# Patient Record
Sex: Male | Born: 1960 | Race: White | Hispanic: No | Marital: Married | Smoking: Former smoker
Health system: Southern US, Community
[De-identification: ages and names within clinical notes are randomized; demographics above are authoritative.]

## PROBLEM LIST (undated history)

## (undated) DIAGNOSIS — I10 Essential (primary) hypertension: Secondary | ICD-10-CM

## (undated) DIAGNOSIS — M1990 Unspecified osteoarthritis, unspecified site: Secondary | ICD-10-CM

## (undated) DIAGNOSIS — R569 Unspecified convulsions: Secondary | ICD-10-CM

## (undated) DIAGNOSIS — I2510 Atherosclerotic heart disease of native coronary artery without angina pectoris: Secondary | ICD-10-CM

## (undated) DIAGNOSIS — I471 Supraventricular tachycardia, unspecified: Secondary | ICD-10-CM

## (undated) DIAGNOSIS — G4730 Sleep apnea, unspecified: Secondary | ICD-10-CM

## (undated) DIAGNOSIS — D696 Thrombocytopenia, unspecified: Secondary | ICD-10-CM

## (undated) HISTORY — PX: EYE SURGERY: SHX253

## (undated) HISTORY — DX: Supraventricular tachycardia, unspecified: I47.10

## (undated) HISTORY — DX: Sleep apnea, unspecified: G47.30

## (undated) HISTORY — DX: Supraventricular tachycardia: I47.1

## (undated) HISTORY — DX: Essential (primary) hypertension: I10

---

## 2002-01-29 ENCOUNTER — Encounter: Payer: Self-pay | Admitting: Family Medicine

## 2002-01-29 ENCOUNTER — Ambulatory Visit (HOSPITAL_COMMUNITY): Admission: RE | Admit: 2002-01-29 | Discharge: 2002-01-29 | Payer: Self-pay | Admitting: Family Medicine

## 2002-02-18 ENCOUNTER — Ambulatory Visit (HOSPITAL_COMMUNITY): Admission: RE | Admit: 2002-02-18 | Discharge: 2002-02-18 | Payer: Self-pay | Admitting: Orthopaedic Surgery

## 2002-02-18 ENCOUNTER — Encounter: Payer: Self-pay | Admitting: Orthopaedic Surgery

## 2013-11-20 ENCOUNTER — Emergency Department: Payer: Self-pay | Admitting: Emergency Medicine

## 2013-11-20 ENCOUNTER — Telehealth: Payer: Self-pay

## 2013-11-20 LAB — CBC WITH DIFFERENTIAL/PLATELET
Basophil #: 0.1 10*3/uL (ref 0.0–0.1)
Basophil %: 1.2 %
Eosinophil #: 0.1 10*3/uL (ref 0.0–0.7)
Eosinophil %: 1.7 %
HCT: 48.7 % (ref 40.0–52.0)
HGB: 16 g/dL (ref 13.0–18.0)
Lymphocyte #: 1.8 10*3/uL (ref 1.0–3.6)
Lymphocyte %: 31.4 %
MCH: 30 pg (ref 26.0–34.0)
MCHC: 32.8 g/dL (ref 32.0–36.0)
MCV: 91 fL (ref 80–100)
Monocyte #: 0.7 x10 3/mm (ref 0.2–1.0)
Monocyte %: 12.1 %
Neutrophil #: 3 10*3/uL (ref 1.4–6.5)
Neutrophil %: 53.6 %
Platelet: 119 10*3/uL — ABNORMAL LOW (ref 150–440)
RBC: 5.32 10*6/uL (ref 4.40–5.90)
RDW: 14.2 % (ref 11.5–14.5)
WBC: 5.7 10*3/uL (ref 3.8–10.6)

## 2013-11-20 LAB — TSH: Thyroid Stimulating Horm: 1.26 u[IU]/mL

## 2013-11-20 LAB — COMPREHENSIVE METABOLIC PANEL
Albumin: 3.9 g/dL (ref 3.4–5.0)
Alkaline Phosphatase: 121 U/L — ABNORMAL HIGH
Anion Gap: 5 — ABNORMAL LOW (ref 7–16)
BUN: 14 mg/dL (ref 7–18)
Bilirubin,Total: 0.4 mg/dL (ref 0.2–1.0)
Calcium, Total: 8.3 mg/dL — ABNORMAL LOW (ref 8.5–10.1)
Chloride: 107 mmol/L (ref 98–107)
Co2: 28 mmol/L (ref 21–32)
Creatinine: 1.05 mg/dL (ref 0.60–1.30)
EGFR (African American): >60
EGFR (Non-African Amer.): >60
Glucose: 127 mg/dL — ABNORMAL HIGH (ref 65–99)
Osmolality: 281 (ref 275–301)
Potassium: 3.7 mmol/L (ref 3.5–5.1)
SGOT(AST): 26 U/L (ref 15–37)
SGPT (ALT): 27 U/L (ref 12–78)
Sodium: 140 mmol/L (ref 136–145)
Total Protein: 7.8 g/dL (ref 6.4–8.2)

## 2013-11-20 LAB — TROPONIN I: Troponin-I: <0.02 ng/mL

## 2013-11-20 NOTE — Telephone Encounter (Signed)
Called pt to check on him after being seen in [facility] ED today

## 2013-11-20 NOTE — Telephone Encounter (Signed)
Called pt to check on him after being seen in [facility] ED today. Pt reports that his BP at work today was 260/140, is down to 178/109 right now.  States that he is still currently in the ED right now. Pt would like to wait until he gets home to call and sched an appt to see Dr. Mariah MillingGollan.

## 2013-12-01 ENCOUNTER — Encounter (INDEPENDENT_AMBULATORY_CARE_PROVIDER_SITE_OTHER): Payer: Self-pay

## 2013-12-01 ENCOUNTER — Encounter: Payer: Self-pay | Admitting: Cardiovascular Disease

## 2013-12-01 ENCOUNTER — Ambulatory Visit (INDEPENDENT_AMBULATORY_CARE_PROVIDER_SITE_OTHER): Payer: 59 | Admitting: Cardiovascular Disease

## 2013-12-01 VITALS — BP 160/100 | HR 79 | Ht 72.0 in | Wt 199.0 lb

## 2013-12-01 DIAGNOSIS — F17200 Nicotine dependence, unspecified, uncomplicated: Secondary | ICD-10-CM

## 2013-12-01 DIAGNOSIS — R000 Tachycardia, unspecified: Secondary | ICD-10-CM

## 2013-12-01 DIAGNOSIS — F17210 Nicotine dependence, cigarettes, uncomplicated: Secondary | ICD-10-CM | POA: Insufficient documentation

## 2013-12-01 DIAGNOSIS — G4733 Obstructive sleep apnea (adult) (pediatric): Secondary | ICD-10-CM

## 2013-12-01 DIAGNOSIS — F1010 Alcohol abuse, uncomplicated: Secondary | ICD-10-CM | POA: Insufficient documentation

## 2013-12-01 DIAGNOSIS — I498 Other specified cardiac arrhythmias: Secondary | ICD-10-CM

## 2013-12-01 DIAGNOSIS — I471 Supraventricular tachycardia: Secondary | ICD-10-CM

## 2013-12-01 DIAGNOSIS — I10 Essential (primary) hypertension: Secondary | ICD-10-CM | POA: Insufficient documentation

## 2013-12-01 MED ORDER — HYDROCHLOROTHIAZIDE 25 MG PO TABS: 25.0000 mg | ORAL_TABLET | Freq: Every day | ORAL | Status: DC

## 2013-12-01 MED ORDER — POTASSIUM CHLORIDE ER 10 MEQ PO TBCR: 10.0000 meq | EXTENDED_RELEASE_TABLET | Freq: Every day | ORAL | Status: DC

## 2013-12-01 MED ORDER — PROPRANOLOL HCL 10 MG PO TABS: 10.0000 mg | ORAL_TABLET | Freq: Four times a day (QID) | ORAL | Status: DC | PRN

## 2013-12-01 NOTE — Assessment & Plan Note (Addendum)
Wyatt Santiago presents with marked hypertension. He eats An unrestricted diet including a very high salt. He drinks 6-8 beers everyday. He smokes 1-1/2 packs of cigarettes a day. He also does not get any regular exercise.  For now we will continue the diltiazem which is also helping with his supraventricular tachycardia. We'll add hydrochlorothiazide 25 mg date and potassium chloride 10 mg a day. I think that he'll improve significantly with lifestyle modifications including salt restriction, reduction of his alcohol intake, smoking cessation, and regular exercise.  I have also strongly encouraged him to go back to his meds regular medical doctor and get fitted for a CPAP mask. He's been diagnosed with sleep apnea but never went to get the CPAP fitted.  I told him that he is at high risk for palpitations related to his marked hypertension including stroke, congestive heart failure, coronary artery artery disease, and renal failure.  He has some nonspecific ST and T wave changes which are likely due to his hypertension and maybe due to left hypertrophy. We'll likely need an echocardiogram at some point.  He's not having any symptoms of ischemic heart disease.

## 2013-12-01 NOTE — Assessment & Plan Note (Signed)
Wyatt Santiago presented to the emergency room with an episode of supraventricular tachycardia. I do not have all the records from [facility] but it sounds like the SVT resolved with IV adenosine. He was sent home on diltiazem 120 mg a day.  He's not had any recurrent episodes of SVT but he complains that the diltiazem is making him fatigued.  We had long discussion about medical therapy of SVT versus RF ablation. He has hypertension so he'll need to take some blood pressure medicines anyway and he would like to try medical therapy initially.  We'll continue the diltiazem for now. He may use to the diltiazem and tolerate it better in the future. If not, we can change him to metoprolol for another beta blocker which may offer the same protection against SVT as well some blood pressure reduction. We'll also give him some propranolol to take on an as needed basis if he has recurrent episodes of SVT.We discussed valsalva and stimulation of the diving reflex

## 2013-12-01 NOTE — Patient Instructions (Signed)
Your physician has recommended you make the following change in your medication:  Start HCTZ 25 mg daily  Start K Dur 10 meq daily  Start Propranolol 10 mg 4 times daily as needed for SVT  Your physician recommends that you schedule a follow-up appointment in:  2-3 weeks   Your physician recommends that you return for lab work in:  BMP at your 2-3 week follow up

## 2013-12-01 NOTE — Assessment & Plan Note (Signed)
Has history of sleep apnea that was diagnosed many years ago. He still has multiple actinic episodes according to his family. He never went to get the CPAP mask because he is claustrophobic and didn't think that he would be aware. I strongly encouraged him to call his doctor in be fitted for CPAP mask. This could be part of the issue with his HTN.    He is not obese.  Some of his sleep apnea could be his ETOH intake.

## 2013-12-01 NOTE — Assessment & Plan Note (Signed)
He drinks 6-8 beers everyday. I have strongly encouraged him to reduce his alcohol intake to perhaps 2-3 beers a day. This will decrease the likelihood of alcohol-related complications and will also help with his blood pressure control.

## 2013-12-01 NOTE — Progress Notes (Addendum)
     Mortimer FriesSteven R Langill Date of Birth  09-Sep-1960       [facility]    [place] [facility] [address], [address]  [address], [address] [place], [place]  [address]   [place]  [id number] [phone]    [phone]   Fax  [phone]     Fax [phone]  Problem List: 1. HTN 2.  Obstructive sleep apnea-     History of Present Illness:  Pt has a hx of HTN.  The other day, he had an episode of weakness, mental status changes.  Slurred speech.  A co worker took BP and HR  - found to be elevated.    Went to ER, was found to have SVT.  Received iv adenosine.      Was DC'd on diltiazem 120 a day.  Makes him fatigued.    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  Does not follow any specific diet.  Drinks 6-8 beers a day.    No regular exercise  Meds:   dilt 120 a day   No Known Allergies  Past Medical History  Diagnosis Date  . Hypertension   . Sleep apnea     History reviewed. No pertinent past surgical history.  History  Smoking status  . Current Every Day Smoker -- 1.50 packs/day for 30 years  . Types: Cigarettes  Smokeless tobacco  . Not on file    History  Alcohol Use  . 4.8 oz/week  . 8 Cans of beer per week    Family History  Problem Relation Age of Onset  . Hypertension Father     Reviw of Systems:  Reviewed in the HPI.  All other systems are negative.  Physical Exam: Blood pressure 160/100, pulse 79, height 6' (1.829 m), weight 199 lb (90.266 kg). Wt Readings from Last 3 Encounters:  12/01/13 199 lb (90.266 kg)     General: Well developed, well nourished, in no acute distress.  Head: Normocephalic, atraumatic, sclera non-icteric, mucus membranes are moist,   Neck: Supple. Carotids are 2 + without bruits. No JVD   Lungs: Clear   Heart: RR, soft systolic murmur  Abdomen: Soft, non-tender, non-distended with normal bowel sounds.  Msk:  Strength and tone are normal   Extremities: No  clubbing or cyanosis. No edema.  Distal pedal pulses are 2+ and equal    Neuro: CN II - XII intact.  Alert and oriented X 3.   Psych:  Normal   ECG:  A;pril 13, 2015:  NSR , NS ST changes  Assessment / Plan:

## 2013-12-01 NOTE — Assessment & Plan Note (Signed)
i have strongly encouraged him to stop smoking

## 2013-12-16 ENCOUNTER — Ambulatory Visit (INDEPENDENT_AMBULATORY_CARE_PROVIDER_SITE_OTHER): Payer: 59 | Admitting: Cardiovascular Disease

## 2013-12-16 ENCOUNTER — Encounter: Payer: Self-pay | Admitting: Cardiovascular Disease

## 2013-12-16 VITALS — BP 179/98 | HR 74 | Ht 72.0 in | Wt 197.0 lb

## 2013-12-16 DIAGNOSIS — I10 Essential (primary) hypertension: Secondary | ICD-10-CM

## 2013-12-16 DIAGNOSIS — I498 Other specified cardiac arrhythmias: Secondary | ICD-10-CM

## 2013-12-16 DIAGNOSIS — F1010 Alcohol abuse, uncomplicated: Secondary | ICD-10-CM

## 2013-12-16 DIAGNOSIS — I471 Supraventricular tachycardia: Secondary | ICD-10-CM

## 2013-12-16 MED ORDER — LISINOPRIL 10 MG PO TABS: 10.0000 mg | ORAL_TABLET | Freq: Every day | ORAL | Status: DC

## 2013-12-16 MED ORDER — DILTIAZEM HCL ER COATED BEADS 240 MG PO CP24: 240.0000 mg | ORAL_CAPSULE | Freq: Every day | ORAL | Status: DC

## 2013-12-16 NOTE — Assessment & Plan Note (Signed)
His blood pressure is still elevated. Fortunately, he's not had any further episodes of SVT. We will increase his diltiazem to 240 mg a day. We'll also add lisinopril 10 mg a day. I've encouraged him to stop smoking cigarettes completely which will help his blood pressure.  We'll check basic metabolic profile today. I'll see him in 3 months for followup office visit and basic metabolic profile

## 2013-12-16 NOTE — Assessment & Plan Note (Signed)
He is not drinking as much.  I've told him that excessive ETOH may worsen his sleep apnea.  He will discuss this further with his medical doctor.

## 2013-12-16 NOTE — Patient Instructions (Signed)
Your physician has recommended you make the following change in your medication:  Increase Diltiazem to 240 mg daily  Start Lisinopril 10 mg daily   Your physician recommends that you have lab work today: BMP  Your physician wants you to follow-up in: 3 months. You will receive a reminder letter in the mail two months in advance. If you don't receive a letter, please call our office to schedule the follow-up appointment.  Your physician recommends that you return for lab work in:  3 months BMP

## 2013-12-16 NOTE — Assessment & Plan Note (Signed)
He's not had any further episodes of supraventricular tachycardia. She's on low-dose diltiazem. We will increase his diltiazem to help his blood pressure.

## 2013-12-16 NOTE — Progress Notes (Signed)
Wyatt Santiago Date of Birth  12-30-1960       [facility]    [place] [facility] [address]  [address] [place]  [address]   [place], [place]  [id number] [phone]    [phone]   Fax  [phone]     Fax [phone]  Problem List: 1. HTN 2.  Obstructive sleep apnea-   3. Supraventricular tachycardia   History of Present Illness:  Pt has a hx of HTN.  The other day, he had an episode of weakness, mental status changes.  Slurred speech.  A co worker took BP and HR  - found to be elevated.    Went to ER, was found to have SVT.  Received iv adenosine.      Was DC'd on diltiazem 120 a day.  Makes him fatigued.    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  Does not follow any specific diet.  Drinks 6-8 beers a day.    No regular exercise  December 16, 2013:  Pt presents today for followup of his supraventricular tachycardia. I perceive [facility] and he did indeed have SVT treated with adenosine. He was started on diltiazem. He's not had any further episodes of SVT. His blood pressure remains elevated. He's been watching his diet. He's been limiting fried foods. He's tried to cut back on his smoking.  Meds:    Current Outpatient Prescriptions on File Prior to Visit  Medication Sig Dispense Refill  . diltiazem (CARDIZEM SR) 120 MG 12 hr capsule Take 120 mg by mouth 2 (two) times daily.      . hydrochlorothiazide (HYDRODIURIL) 25 MG tablet Take 1 tablet (25 mg total) by mouth daily.  90 tablet  3  . ibuprofen (ADVIL,MOTRIN) 100 MG tablet Take 100 mg by mouth every 6 (six) hours as needed for fever.      . potassium chloride (K-DUR) 10 MEQ tablet Take 1 tablet (10 mEq total) by mouth daily.  90 tablet  3  . propranolol (INDERAL) 10 MG tablet Take 1 tablet (10 mg total) by mouth 4 (four) times daily as needed (for SVT).  30 tablet  12   No current facility-administered  medications on file prior to visit.      No Known Allergies  Past Medical History  Diagnosis Date  . Hypertension   . Sleep apnea     History reviewed. No pertinent past surgical history.  History  Smoking status  . Current Every Day Smoker -- 1.50 packs/day for 30 years  . Types: Cigarettes  Smokeless tobacco  . Not on file    History  Alcohol Use  . 4.8 oz/week  . 8 Cans of beer per week    Family History  Problem Relation Age of Onset  . Hypertension Father     Reviw of Systems:  Reviewed in the HPI.  All other systems are negative.  Physical Exam: Blood pressure 179/98, pulse 74, height 6' (1.829 m), weight 197 lb (89.359 kg). Wt Readings from Last 3 Encounters:  12/16/13 197 lb (89.359 kg)  12/01/13 199 lb (90.266 kg)   General: Well developed, well nourished, in no acute distress.  Head: Normocephalic, atraumatic, sclera non-icteric, mucus membranes are moist,   Neck: Supple. Carotids are 2 + without bruits. No JVD   Lungs: Clear   Heart: RR, soft systolic murmur  Abdomen: Soft, non-tender, non-distended with normal bowel sounds.  Msk:  Strength and tone are normal   Extremities: No clubbing or cyanosis. No edema.  Distal pedal pulses are 2+ and equal    Neuro: CN II - XII intact.  Alert and oriented X 3.   Psych:  Normal   ECG:  12/16/2013: Normal sinus rhythm at 74. His left ventricular hypertrophy with QRS widening  Assessment / Plan:

## 2013-12-16 NOTE — Assessment & Plan Note (Signed)
He has a history of sleep apnea. He needs to get this evaluated further. He does not use a CPAP mask.

## 2013-12-17 LAB — BASIC METABOLIC PANEL
BUN/Creatinine Ratio: 18 (ref 9–20)
BUN: 16 mg/dL (ref 6–24)
CO2: 25 mmol/L (ref 18–29)
Calcium: 9.1 mg/dL (ref 8.7–10.2)
Chloride: 97 mmol/L (ref 97–108)
Creatinine, Ser: 0.89 mg/dL (ref 0.76–1.27)
GFR calc Af Amer: 112 mL/min/{1.73_m2} (ref >59)
GFR calc non Af Amer: 97 mL/min/{1.73_m2} (ref >59)
Glucose: 121 mg/dL — ABNORMAL HIGH (ref 65–99)
Potassium: 3.7 mmol/L (ref 3.5–5.2)
Sodium: 140 mmol/L (ref 134–144)

## 2013-12-18 ENCOUNTER — Telehealth: Payer: Self-pay | Admitting: Cardiovascular Disease

## 2013-12-18 NOTE — Telephone Encounter (Signed)
New message ° ° ° ° °Returning Wyatt Santiago's call °

## 2013-12-18 NOTE — Progress Notes (Signed)
Quick Note:  Patient notified of lab results. Discussed limiting sugar intake as glucose was elevated to 121. Patient verbalized agreement with current treatment plan and said he would watch out for extra sugar intake. Patient has no further questions. ______

## 2013-12-18 NOTE — Telephone Encounter (Signed)
Patient notified of lab results. Discussed limiting sugar intake as glucose was elevated to 121.  Patient verbalized agreement with current treatment plan and said he would watch out for extra sugar intake. Patient has no further questions.

## 2014-03-07 ENCOUNTER — Encounter (HOSPITAL_COMMUNITY): Payer: Self-pay | Admitting: Emergency Medicine

## 2014-03-07 ENCOUNTER — Emergency Department (HOSPITAL_COMMUNITY)
Admission: EM | Admit: 2014-03-07 | Discharge: 2014-03-07 | Disposition: A | Discharge status: Home / Self Care | Payer: 59 | Attending: Emergency Medicine | Admitting: Emergency Medicine

## 2014-03-07 DIAGNOSIS — S6992XA Unspecified injury of left wrist, hand and finger(s), initial encounter: Secondary | ICD-10-CM

## 2014-03-07 DIAGNOSIS — F17200 Nicotine dependence, unspecified, uncomplicated: Secondary | ICD-10-CM | POA: Insufficient documentation

## 2014-03-07 DIAGNOSIS — W268XXA Contact with other sharp object(s), not elsewhere classified, initial encounter: Secondary | ICD-10-CM | POA: Insufficient documentation

## 2014-03-07 DIAGNOSIS — S60459A Superficial foreign body of unspecified finger, initial encounter: Secondary | ICD-10-CM | POA: Insufficient documentation

## 2014-03-07 DIAGNOSIS — Z23 Encounter for immunization: Secondary | ICD-10-CM | POA: Insufficient documentation

## 2014-03-07 DIAGNOSIS — I10 Essential (primary) hypertension: Secondary | ICD-10-CM | POA: Insufficient documentation

## 2014-03-07 DIAGNOSIS — Y9389 Activity, other specified: Secondary | ICD-10-CM | POA: Insufficient documentation

## 2014-03-07 DIAGNOSIS — Y929 Unspecified place or not applicable: Secondary | ICD-10-CM | POA: Insufficient documentation

## 2014-03-07 DIAGNOSIS — Z79899 Other long term (current) drug therapy: Secondary | ICD-10-CM | POA: Insufficient documentation

## 2014-03-07 MED ORDER — TETANUS-DIPHTH-ACELL PERTUSSIS 5-2.5-18.5 LF-MCG/0.5 IM SUSP
0.5000 mL | Freq: Once | INTRAMUSCULAR | Status: AC
  Administered 2014-03-07: 0.5 mL via INTRAMUSCULAR
  Filled 2014-03-07: qty 0.5

## 2014-03-07 MED ORDER — LIDOCAINE HCL (PF) 2 % IJ SOLN
INTRAMUSCULAR | Status: AC
  Administered 2014-03-07: 22:00:00
  Filled 2014-03-07: qty 10

## 2014-03-07 MED ORDER — HYDROGEN PEROXIDE 3 % EX SOLN
CUTANEOUS | Status: AC
  Administered 2014-03-07: 22:00:00
  Filled 2014-03-07: qty 473

## 2014-03-07 NOTE — ED Notes (Signed)
Pt has a fish hook in his left middle finger.

## 2014-03-07 NOTE — ED Provider Notes (Signed)
CSN: [id number]     Arrival date & time 03/07/14  2137 History   First MD Initiated Contact with Patient 03/07/14 2204     This chart was scribed for Wyatt Santiago, * by Arlan Organ, ED Scribe. This patient was seen in room APA09/APA09 and the patient's care was started 10:08 PM.   No chief complaint on file.  HPI  HPI Comments: Wyatt Santiago is a 53 y.o. Male with a PMHx of HTN and sleep apnea who presents to the Emergency Department complaining of a fish hook to the L 3rd digit sustained 1.5 hours prior to arrival. Pt states he was fishing when he accidentally hooked his finger earlier today. Pt states he attempted to pull the hook out without any success. At this time he denies any fever or chills. No weakness, numbness, paresthesia, or loss of sensation. Pt is due for a Tetanus shot today. No known allergies to medications. He has no other pertinent past medical history. No other concerns this visit.   Past Medical History  Diagnosis Date  . Hypertension   . Sleep apnea    History reviewed. No pertinent past surgical history. Family History  Problem Relation Age of Onset  . Hypertension Father    History  Substance Use Topics  . Smoking status: Current Every Day Smoker -- 1.50 packs/day for 30 years    Types: Cigarettes  . Smokeless tobacco: Not on file  . Alcohol Use: 4.8 oz/week    8 Cans of beer per week    Review of Systems  Constitutional: Negative for fever and chills.  Skin: Positive for wound. Negative for rash.  Psychiatric/Behavioral: Negative for confusion.      Allergies  Review of patient's allergies indicates no known allergies.  Home Medications   Prior to Admission medications   Medication Sig Start Date End Date Taking? Authorizing Provider  diltiazem (CARDIZEM CD) 240 MG 24 hr capsule Take 1 capsule (240 mg total) by mouth daily. 12/16/13   Vesta Mixer, MD  hydrochlorothiazide (HYDRODIURIL) 25 MG tablet Take 1 tablet (25 mg total) by  mouth daily. 12/01/13   Vesta Mixer, MD  ibuprofen (ADVIL,MOTRIN) 100 MG tablet Take 100 mg by mouth every 6 (six) hours as needed for fever.    Historical Provider, MD  lisinopril (PRINIVIL,ZESTRIL) 10 MG tablet Take 1 tablet (10 mg total) by mouth daily. 12/16/13   Vesta Mixer, MD  potassium chloride (K-DUR) 10 MEQ tablet Take 1 tablet (10 mEq total) by mouth daily. 12/01/13   Vesta Mixer, MD  propranolol (INDERAL) 10 MG tablet Take 1 tablet (10 mg total) by mouth 4 (four) times daily as needed (for SVT). 12/01/13   Vesta Mixer, MD   Triage Vitals: BP 136/82  Pulse 100  Temp(Src) 98.6 F (37 C) (Oral)  Resp 20  Ht 6' (1.829 m)  Wt 186 lb (84.369 kg)  BMI 25.22 kg/m2  SpO2 100%   Physical Exam  Constitutional: He is oriented to person, place, and time. He appears well-developed and well-nourished. No distress.  HENT:  Head: Normocephalic and atraumatic.  Right Ear: Hearing normal.  Left Ear: Hearing normal.  Nose: Nose normal.  Mouth/Throat: Oropharynx is clear and moist and mucous membranes are normal.  Eyes: Conjunctivae and EOM are normal. Pupils are equal, round, and reactive to light.  Neck: Normal range of motion. Neck supple.  Cardiovascular: Regular rhythm, S1 normal and S2 normal.  Exam reveals no gallop and no  friction rub.   No murmur heard. Pulmonary/Chest: Effort normal and breath sounds normal. No respiratory distress. He exhibits no tenderness.  Abdominal: Soft. Normal appearance and bowel sounds are normal. There is no hepatosplenomegaly. There is no tenderness. There is no rebound, no guarding, no tenderness at McBurney's point and negative Murphy's sign. No hernia.  Musculoskeletal: Normal range of motion.  Neurological: He is alert and oriented to person, place, and time. He has normal strength. No cranial nerve deficit or sensory deficit. Coordination normal. GCS eye subscore is 4. GCS verbal subscore is 5. GCS motor subscore is 6.  Skin: Skin is warm,  dry and intact. No rash noted. No cyanosis.  Psychiatric: He has a normal mood and affect. His speech is normal and behavior is normal. Thought content normal.    ED Course  Procedures (including critical care time)  Procedure: Digital Block Area prepped with betadine and sterile towels applied. Landmarks identified. A 27-guage 1 1/4 inch needle was advanced dorsally, adjacent to the base of the proximal phalynx. Aspiration revealed no blood return. 4ml of 1% Lidocaine was injected. The steps were repeated on the other side of the phalynx. Patient tolerated the procedure well and there were no complications.  Procedure: Fishhook removal Area was prepped with Betadine. Barbara the hook was disengaged from the skin by gently changing the angle of the hook and then advancing 18-gauge needle along the shaft of the hook until the barb was encountered. The barb was then covered with the tip of the 18-gauge needle and the needle and fishhook were easily removed the skin. Patient tolerated procedure well. No complications.  DIAGNOSTIC STUDIES: Oxygen Saturation is 100% on RA, normal by my interpretation.    COORDINATION OF CARE: 10:13 PM- Will pull hook out of finger under local anesthetic. Discussed treatment plan with pt at bedside and pt agreed to plan.     Labs Review Labs Reviewed - No data to display  Imaging Review No results found.   EKG Interpretation None      MDM   Final diagnoses:  Fish hook injury of finger of left hand, initial encounter    Presented to the ER for evaluation of a fishhook in the tip of the left middle finger. A digital block was performed and a fishhook was removed without difficulty. Patient administered tetanus vaccination. The area was cleaned and dressed appropriately. Patient will watch for signs of infection.   I personally performed the services described in this documentation, which was scribed in my presence. The recorded information has been  reviewed and is accurate.    Wyatt Creasehristopher J. Pollina, MD 03/07/14 2228

## 2014-03-07 NOTE — Discharge Instructions (Signed)
Fish Hook Removal °A fish hook can cause a small cut or lesion that extends through all layers of the skin and into the subcutaneous tissue. Because of this, bacteria may get injected beneath the surface of the skin. A simple bandage (dressing) may be applied. This should be changed daily. Follow your caregiver's instructions regarding use of any antibacterial ointments.  °Only take over-the-counter or prescription medicines for pain, discomfort, or fever as directed by your caregiver. °If you did not receive a tetanus shot from your caregiver because you did not recall when your last one was given, make sure to check with your physician's office and determine if one is needed. Generally for a "dirty" wound, you should receive a tetanus booster if you have not had one in the last five years. If you have a "clean" wound, you should receive a tetanus booster if you have not had one within the last ten years. °SEEK IMMEDIATE MEDICAL CARE IF:  °· You develop redness, swelling, or increasing pain in the wound. °· You have a fever. °· You notice a bad smell coming from the wound or dressing. °· You notice pus or other unusual drainage coming from the wound. °Document Released: 08/04/2000 Document Revised: 10/30/2011 Document Reviewed: 02/25/2009 °Exit[employer]® Patient Information ©2015 [employer]. This information is not intended to replace advice given to you by your health care provider. Make sure you discuss any questions you have with your health care provider. ° ° °

## 2014-03-17 ENCOUNTER — Ambulatory Visit (INDEPENDENT_AMBULATORY_CARE_PROVIDER_SITE_OTHER): Payer: 59 | Admitting: Cardiovascular Disease

## 2014-03-17 ENCOUNTER — Encounter: Payer: Self-pay | Admitting: Cardiovascular Disease

## 2014-03-17 VITALS — BP 110/80 | HR 85 | Ht 72.0 in | Wt 186.5 lb

## 2014-03-17 DIAGNOSIS — I471 Supraventricular tachycardia: Secondary | ICD-10-CM

## 2014-03-17 DIAGNOSIS — I498 Other specified cardiac arrhythmias: Secondary | ICD-10-CM

## 2014-03-17 DIAGNOSIS — I10 Essential (primary) hypertension: Secondary | ICD-10-CM

## 2014-03-17 NOTE — Assessment & Plan Note (Signed)
His blood pressure is much better controlled today and in fact he has had symptoms consistent with orthostatic hypotension. I think that this is a combination of his HCTZ plus the fact that he is improved his diet and he's decreased his smoking. In addition, he's been walking 2 miles every day.  At this point we'll stop the HCTZ and potassium.  I've encouraged him to continue a healthy lifestyle. He will continue to monitor but his blood pressure and will call his back and his blood pressure readings increase. I'll see him in 6 months for followup visit.

## 2014-03-17 NOTE — Patient Instructions (Signed)
Your physician has recommended you make the following change in your medication:  Stop HCTZ  Stop Potassium   Your physician wants you to follow-up in: 6 months. You will receive a reminder letter in the mail two months in advance. If you don't receive a letter, please call our office to schedule the follow-up appointment.

## 2014-03-17 NOTE — Progress Notes (Signed)
Wyatt FriesSteven R Goyne Date of Birth  Mar 03, 1961       [facility]    [facility] [place] [facility] [address], [address]  [address], [address] [place], [place]  [address]   [place], [place]  [address] [phone]    [phone]   Fax  [phone]     Fax [phone]  Problem List: 1. HTN 2.  Obstructive sleep apnea-   3. Supraventricular tachycardia   History of Present Illness:  Pt has a hx of HTN.  The other day, he had an episode of weakness, mental status changes.  Slurred speech.  A co worker took BP and HR  - found to be elevated.    Went to ER, was found to have SVT.  Received iv adenosine.      Was DC'd on diltiazem 120 a day.  Makes him fatigued.    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  Does not follow any specific diet.  Drinks 6-8 beers a day.    No regular exercise  December 16, 2013:  Pt presents today for followup of his supraventricular tachycardia. I perceive the E[facility] is from [facility] and he did indeed have SVT treated with adenosine. He was started on diltiazem. He's not had any further episodes of SVT. His blood pressure remains elevated. He's been watching his diet. He's been limiting fried foods. He's tried to cut back on his smoking.  03/17/2014: [name] is seen back today for followup visit. He has a history of SVT, hypertension.Marland Kitchen. He's not had any further episodes of SVT.  He's been using an electronic cigarette and has cut back on his smoking .  He's cut his alcohol intake quite dramatically. He has occasional episodes of presyncope. These symptoms sound like orthostatic hypotension. It primarily occur at work when he goes from a stooped position to a standing position.   His orthostatic hypotension seemed to start after we started the HCTZ.  Meds:    Current Outpatient Prescriptions on File Prior to Visit  Medication Sig Dispense Refill  . diltiazem (CARDIZEM CD) 240 MG 24 hr capsule Take 1 capsule  (240 mg total) by mouth daily.  90 capsule  3  . hydrochlorothiazide (HYDRODIURIL) 25 MG tablet Take 1 tablet (25 mg total) by mouth daily.  90 tablet  3  . ibuprofen (ADVIL,MOTRIN) 100 MG tablet Take 100 mg by mouth every 6 (six) hours as needed for fever.      Marland Kitchen. lisinopril (PRINIVIL,ZESTRIL) 10 MG tablet Take 1 tablet (10 mg total) by mouth daily.  90 tablet  3  . potassium chloride (K-DUR) 10 MEQ tablet Take 1 tablet (10 mEq total) by mouth daily.  90 tablet  3  . propranolol (INDERAL) 10 MG tablet Take 1 tablet (10 mg total) by mouth 4 (four) times daily as needed (for SVT).  30 tablet  12   No current facility-administered medications on file prior to visit.      Not on File  Past Medical History  Diagnosis Date  . Hypertension   . Sleep apnea     History reviewed. No pertinent past surgical history.  History  Smoking status  . Current Every Day Smoker -- 1.50 packs/day for 30 years  . Types: Cigarettes  Smokeless tobacco  . Not on file    History  Alcohol Use  . 4.8 oz/week  . 8 Cans of beer per week  Family History  Problem Relation Age of Onset  . Hypertension Father     Reviw of Systems:  Reviewed in the HPI.  All other systems are negative.  Physical Exam: Blood pressure 110/80, pulse 85, height 6' (1.829 m), weight 186 lb 8 oz (84.596 kg). Wt Readings from Last 3 Encounters:  03/17/14 186 lb 8 oz (84.596 kg)  03/07/14 186 lb (84.369 kg)  12/16/13 197 lb (89.359 kg)   General: Well developed, well nourished, in no acute distress.  Head: Normocephalic, atraumatic, sclera non-icteric, mucus membranes are moist,   Neck: Supple. Carotids are 2 + without bruits. No JVD   Lungs: Clear   Heart: RR, soft systolic murmur  Abdomen: Soft, non-tender, non-distended with normal bowel sounds.  Msk:  Strength and tone are normal   Extremities: No clubbing or cyanosis. No edema.  Distal pedal pulses are 2+ and equal    Neuro: CN II - XII intact.  Alert  and oriented X 3.   Psych:  Normal   ECG:  03/17/2014: Normal sinus rhythm at 85. He has no ST or T wave changes.  Assessment / Plan:

## 2014-05-18 ENCOUNTER — Other Ambulatory Visit: Payer: Self-pay

## 2014-05-18 MED ORDER — DILTIAZEM HCL ER COATED BEADS 240 MG PO CP24: 240.0000 mg | ORAL_CAPSULE | Freq: Every day | ORAL | Status: DC

## 2014-05-18 MED ORDER — LISINOPRIL 10 MG PO TABS: 10.0000 mg | ORAL_TABLET | Freq: Every day | ORAL | Status: DC

## 2014-05-18 NOTE — Telephone Encounter (Signed)
Refill sent for dilitiazem and lisinopril

## 2014-11-12 ENCOUNTER — Ambulatory Visit (INDEPENDENT_AMBULATORY_CARE_PROVIDER_SITE_OTHER): Payer: 59 | Admitting: Cardiovascular Disease

## 2014-11-12 ENCOUNTER — Encounter: Payer: Self-pay | Admitting: Cardiovascular Disease

## 2014-11-12 VITALS — BP 144/92 | HR 73 | Ht 72.0 in | Wt 197.2 lb

## 2014-11-12 DIAGNOSIS — I10 Essential (primary) hypertension: Secondary | ICD-10-CM | POA: Diagnosis not present

## 2014-11-12 DIAGNOSIS — I471 Supraventricular tachycardia: Secondary | ICD-10-CM | POA: Diagnosis not present

## 2014-11-12 MED ORDER — LISINOPRIL 20 MG PO TABS: 20.0000 mg | ORAL_TABLET | Freq: Every day | ORAL | Status: DC

## 2014-11-12 NOTE — Progress Notes (Signed)
Cardiology Office Note   Date:  11/12/2014   ID:  Wyatt FriesSteven R Santiago, DOB 03/22/1961, MRN [id number]  PCP:  Wyatt SmilesFUSCO,LAWRENCE Santiago., MD  Cardiologist:   Wyatt MixerNahser, Philip J, MD   Chief Complaint  Patient presents with  . other    6 month f/u no complaints. Meds reviewd verbally with pt.   1. HTN 2. Obstructive sleep apnea-  3. Supraventricular tachycardia   History of Present Illness:  Pt has a hx of HTN. The other day, he had an episode of weakness, mental status changes. Slurred speech. A co worker took BP and HR - found to be elevated. Went to ER, was found to have SVT. Received iv adenosine. Was DC'd on diltiazem 120 a day. Makes him fatigued.   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  Does not follow any specific diet.  Drinks 6-8 beers a day.   No regular exercise  December 16, 2013:  Pt presents today for followup of his supraventricular tachycardia. I perceive the [facility] and he did indeed have SVT treated with adenosine. He was started on diltiazem. He's not had any further episodes of SVT. His blood pressure remains elevated. He's been watching his diet. He's been limiting fried foods. He's tried to cut back on his smoking.  03/17/2014: Wyatt Santiago is seen back today for followup visit. He has a history of SVT, hypertension.Marland Kitchen. He's not had any further episodes of SVT. He's been using an electronic cigarette and has cut back on his smoking . He's cut his alcohol intake quite dramatically. He has occasional episodes of presyncope. These symptoms sound like orthostatic hypotension. It primarily occur at work when he goes from a stooped position to a standing position. His orthostatic hypotension seemed to start after we started the HCTZ.   November 12, 2014:  Wyatt FriesSteven R Santiago is a 54 y.o. male who presents for follow-up of his superventricular tachycardia and hypertension. During his last visit we discontinued  the HCTZ because of symptoms of orthostatic hypotension.  He has done well.  No further episodes of orthostatic hypertension. He also has not had any further episodes of SVT.  Past Medical History  Diagnosis Date  . Hypertension   . Sleep apnea     History reviewed. No pertinent past surgical history.   Current Outpatient Prescriptions  Medication Sig Dispense Refill  . diltiazem (CARDIZEM CD) 240 MG 24 hr capsule Take 1 capsule (240 mg total) by mouth daily. (Patient not taking: Reported on 11/12/2014) 90 capsule 3  . ibuprofen (ADVIL,MOTRIN) 100 MG tablet Take 100 mg by mouth every 6 (six) hours as needed for fever.    Marland Kitchen. lisinopril (PRINIVIL,ZESTRIL) 10 MG tablet Take 1 tablet (10 mg total) by mouth daily. (Patient not taking: Reported on 11/12/2014) 90 tablet 3  . propranolol (INDERAL) 10 MG tablet Take 1 tablet (10 mg total) by mouth 4 (four) times daily as needed (for SVT). (Patient not taking: Reported on 11/12/2014) 30 tablet 12   No current facility-administered medications for this visit.    Allergies:   Review of patient's allergies indicates no known allergies.    Social History:  The patient  reports that he has been smoking Cigarettes.  He has a 45 pack-year smoking history. He does not have any smokeless tobacco history on file. He reports that he drinks about 4.8 oz of alcohol per week. He reports that he does not use illicit drugs.  Family History:  The patient's family history includes Hypertension in his father.    ROS:  Please see the history of present illness.    Review of Systems: Constitutional:  denies fever, chills, diaphoresis, appetite change and fatigue.  HEENT: denies photophobia, eye pain, redness, hearing loss, ear pain, congestion, sore throat, rhinorrhea, sneezing, neck pain, neck stiffness and tinnitus.  Respiratory: denies SOB, DOE, cough, chest tightness, and wheezing.  Cardiovascular: denies chest pain, palpitations and leg swelling.    Gastrointestinal: denies nausea, vomiting, abdominal pain, diarrhea, constipation, blood in stool.  Genitourinary: denies dysuria, urgency, frequency, hematuria, flank pain and difficulty urinating.  Musculoskeletal: denies  myalgias, back pain, joint swelling, arthralgias and gait problem.   Skin: denies pallor, rash and wound.  Neurological: denies dizziness, seizures, syncope, weakness, light-headedness, numbness and headaches.   Hematological: denies adenopathy, easy bruising, personal or family bleeding history.  Psychiatric/ Behavioral: denies suicidal ideation, mood changes, confusion, nervousness, sleep disturbance and agitation.       All other systems are reviewed and negative.    PHYSICAL EXAM: VS:  BP 144/92 mmHg  Pulse 73  Ht 6' (1.829 m)  Wt 197 lb 4 oz (89.472 kg)  BMI 26.75 kg/m2 , BMI Body mass index is 26.75 kg/(m^2). GEN: Well nourished, well developed, in no acute distress HEENT: normal Neck: no JVD, carotid bruits, or masses Cardiac: RRR; no murmurs, rubs, or gallops,no edema  Respiratory:  clear to auscultation bilaterally, normal work of breathing GI: soft, nontender, nondistended, + BS MS: no deformity or atrophy Skin: warm and dry, no rash Neuro:  Strength and sensation are intact Psych: normal   EKG:  EKG is not ordered today.    Recent Labs: 12/16/2013: BUN 16; Creatinine 0.89; Potassium 3.7; Sodium 140    Lipid Panel No results found for: CHOL, TRIG, HDL, CHOLHDL, VLDL, LDLCALC, LDLDIRECT    Wt Readings from Last 3 Encounters:  11/12/14 197 lb 4 oz (89.472 kg)  03/17/14 186 lb 8 oz (84.596 kg)  03/07/14 186 lb (84.369 kg)      Other studies Reviewed: Additional studies/ records that were reviewed today include: . Review of the above records demonstrates:    ASSESSMENT AND PLAN:  1. HTN he did not tolerate HCTZ because of orthostatic hypotension.     continue diltiazem. Increase Lisinopril to 20 mg a day   2. Obstructive sleep  apnea-   3. Supraventricular tachycardia- no recent symptoms of SVT.   Continue diltiazem at current dose.    Current medicines are reviewed at length with the patient today.  The patient does not have concerns regarding medicines.  The following changes have been made:  Increase Lisinopril to 20 a day   Labs/ tests ordered today include: Orders Placed This Encounter  Procedures  . EKG 12-Lead     Disposition:   FU with me in 3 months     Signed, Nahser, Deloris Ping, MD  11/12/2014 11:04 AM    [facility] [address] [place], [place], [place]  [address] Phone: [phone]; Fax: [phone]

## 2014-11-12 NOTE — Patient Instructions (Signed)
Your physician has recommended you make the following change in your medication:  Increase Lisinopril to 20 mg once daily   Your physician recommends that you schedule a follow-up appointment in:  3 months   Your physician recommends that you return for lab work in:  BMP at your 3 month visit

## 2015-01-07 ENCOUNTER — Encounter: Payer: Self-pay | Admitting: Cardiovascular Disease

## 2015-01-07 ENCOUNTER — Ambulatory Visit (INDEPENDENT_AMBULATORY_CARE_PROVIDER_SITE_OTHER): Payer: 59 | Admitting: Cardiovascular Disease

## 2015-01-07 VITALS — BP 120/82 | HR 72 | Ht 72.0 in | Wt 198.2 lb

## 2015-01-07 DIAGNOSIS — I471 Supraventricular tachycardia: Secondary | ICD-10-CM | POA: Diagnosis not present

## 2015-01-07 DIAGNOSIS — I10 Essential (primary) hypertension: Secondary | ICD-10-CM

## 2015-01-07 DIAGNOSIS — E785 Hyperlipidemia, unspecified: Secondary | ICD-10-CM

## 2015-01-07 NOTE — Progress Notes (Signed)
Cardiology Office Note   Date:  01/07/2015   ID:  Wyatt Santiago, DOB November 26, 1960, MRN [id number]  PCP:  Cassell SmilesFUSCO,LAWRENCE J., MD  Cardiologist:   Vesta MixerNahser, Linde Wilensky J, MD   Chief Complaint  Patient presents with  . other    3 month follow up. Meds reviewed by the patient verbally. "doing well."    1. HTN 2. Obstructive sleep apnea-  3. Supraventricular tachycardia   History of Present Illness:  Pt has a hx of HTN. The other day, he had an episode of weakness, mental status changes. Slurred speech. A co worker took BP and HR - found to be elevated. Went to ER, was found to have SVT. Received iv adenosine. Was DC'd on diltiazem 120 a day. Makes him fatigued.   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  Does not follow any specific diet.  Drinks 6-8 beers a day.   No regular exercise  December 16, 2013:  Pt presents today for followup of his supraventricular tachycardia. I [facility] and he did indeed have SVT treated with adenosine. He was started on diltiazem. He's not had any further episodes of SVT. His blood pressure remains elevated. He's been watching his diet. He's been limiting fried foods. He's tried to cut back on his smoking.  03/17/2014: Wyatt Santiago is seen back today for followup visit. He has a history of SVT, hypertension.Marland Kitchen. He's not had any further episodes of SVT. He's been using an electronic cigarette and has cut back on his smoking . He's cut his alcohol intake quite dramatically. He has occasional episodes of presyncope. These symptoms sound like orthostatic hypotension. It primarily occur at work when he goes from a stooped position to a standing position. His orthostatic hypotension seemed to start after we started the HCTZ.   November 12, 2014:  Wyatt Santiago is a 54 y.o. male who presents for follow-up of his superventricular tachycardia and hypertension. During his last visit we  discontinued the HCTZ because of symptoms of orthostatic hypotension.  He has done well.  No further episodes of orthostatic hypertension. He also has not had any further episodes of SVT.  Jan 07, 2015:  Wyatt Santiago is doing very well. His blood pressure has been well controlled. He's not had any further episodes of supraventricular tachycardia. He remains quite active working at the [employer].   Past Medical History  Diagnosis Date  . Hypertension   . Sleep apnea   . SVT (supraventricular tachycardia)     History reviewed. No pertinent past surgical history.   Current Outpatient Prescriptions  Medication Sig Dispense Refill  . diltiazem (CARDIZEM CD) 240 MG 24 hr capsule Take 1 capsule (240 mg total) by mouth daily. 90 capsule 3  . ibuprofen (ADVIL,MOTRIN) 100 MG tablet Take 100 mg by mouth every 6 (six) hours as needed for fever.    Marland Kitchen. lisinopril (PRINIVIL,ZESTRIL) 20 MG tablet Take 1 tablet (20 mg total) by mouth daily. 90 tablet 3  . propranolol (INDERAL) 10 MG tablet Take 1 tablet (10 mg total) by mouth 4 (four) times daily as needed (for SVT). 30 tablet 12   No current facility-administered medications for this visit.    Allergies:   Review of patient's allergies indicates no known allergies.    Social History:  The patient  reports that he has been smoking Cigarettes.  He has a 15 pack-year smoking history. He does not have  any smokeless tobacco history on file. He reports that he drinks about 4.8 oz of alcohol per week. He reports that he does not use illicit drugs.   Family History:  The patient's family history includes Hypertension in his father.    ROS:  Please see the history of present illness.    Review of Systems: Constitutional:  denies fever, chills, diaphoresis, appetite change and fatigue.  HEENT: denies photophobia, eye pain, redness, hearing loss, ear pain, congestion, sore throat, rhinorrhea, sneezing, neck pain, neck stiffness and tinnitus.    Respiratory: denies SOB, DOE, cough, chest tightness, and wheezing.  Cardiovascular: denies chest pain, palpitations and leg swelling.  Gastrointestinal: denies nausea, vomiting, abdominal pain, diarrhea, constipation, blood in stool.  Genitourinary: denies dysuria, urgency, frequency, hematuria, flank pain and difficulty urinating.  Musculoskeletal: denies  myalgias, back pain, joint swelling, arthralgias and gait problem.   Skin: denies pallor, rash and wound.  Neurological: denies dizziness, seizures, syncope, weakness, light-headedness, numbness and headaches.   Hematological: denies adenopathy, easy bruising, personal or family bleeding history.  Psychiatric/ Behavioral: denies suicidal ideation, mood changes, confusion, nervousness, sleep disturbance and agitation.       All other systems are reviewed and negative.    PHYSICAL EXAM: VS:  BP 120/82 mmHg  Pulse 72  Ht 6' (1.829 m)  Wt 89.926 kg (198 lb 4 oz)  BMI 26.88 kg/m2 , BMI Body mass index is 26.88 kg/(m^2). GEN: Well nourished, well developed, in no acute distress HEENT: normal Neck: no JVD, carotid bruits, or masses Cardiac: RRR; no murmurs, rubs, or gallops,no edema  Respiratory:  clear to auscultation bilaterally, normal work of breathing GI: soft, nontender, nondistended, + BS MS: no deformity or atrophy Skin: warm and dry, no rash Neuro:  Strength and sensation are intact Psych: normal   EKG:  EKG is not ordered today.    Recent Labs: No results found for requested labs within last 365 days.    Lipid Panel No results found for: CHOL, TRIG, HDL, CHOLHDL, VLDL, LDLCALC, LDLDIRECT    Wt Readings from Last 3 Encounters:  01/07/15 89.926 kg (198 lb 4 oz)  11/12/14 89.472 kg (197 lb 4 oz)  03/17/14 84.596 kg (186 lb 8 oz)      Other studies Reviewed: Additional studies/ records that were reviewed today include: . Review of the above records demonstrates:    ASSESSMENT AND PLAN:  1. HTN he did  not tolerate HCTZ because of orthostatic hypotension.     continue diltiazem. Continue  Lisinopril 20 mg a day   2. Obstructive sleep apnea-   3. Supraventricular tachycardia- no recent symptoms of SVT.   Continue diltiazem at current dose.    Current medicines are reviewed at length with the patient today.  The patient does not have concerns regarding medicines.  The following changes have been made:    Labs/ tests ordered today include: Orders Placed This Encounter  Procedures  . EKG 12-Lead     Disposition:   FU with me in 6 months  .  Will get fasting labs at that time    Signed, Gracielynn Birkel, Deloris PingPhilip J, MD  01/07/2015 9:25 AM    [facility] [address], [place], [place]  [address] Phone: [phone]; Fax: [phone]

## 2015-01-07 NOTE — Patient Instructions (Signed)
Medication Instructions:  Continue current meds  Labwork: Please come in for fasting lipids, liver and BMET  Testing/Procedures: None  Follow-Up: Your physician wants you to follow-up in: 6 months.  You will receive a reminder letter in the mail two months in advance.  If you don't receive a letter, please call our office to schedule the follow-up appointment.

## 2015-03-07 ENCOUNTER — Other Ambulatory Visit: Payer: Self-pay | Admitting: Cardiovascular Disease

## 2015-04-19 ENCOUNTER — Encounter (HOSPITAL_COMMUNITY): Payer: Self-pay | Admitting: Emergency Medicine

## 2015-04-19 ENCOUNTER — Encounter (HOSPITAL_COMMUNITY)
Admission: EM | Disposition: A | Discharge status: Home / Self Care | Payer: Self-pay | Source: Home / Self Care | Attending: Cardiology

## 2015-04-19 ENCOUNTER — Inpatient Hospital Stay (HOSPITAL_COMMUNITY)
Admission: EM | Admit: 2015-04-19 | Discharge: 2015-04-20 | DRG: 247 | Disposition: A | Discharge status: Home / Self Care | Payer: BLUE CROSS/BLUE SHIELD | Attending: Cardiology | Admitting: Cardiology

## 2015-04-19 ENCOUNTER — Emergency Department (HOSPITAL_COMMUNITY): Payer: BLUE CROSS/BLUE SHIELD

## 2015-04-19 DIAGNOSIS — I200 Unstable angina: Secondary | ICD-10-CM | POA: Diagnosis present

## 2015-04-19 DIAGNOSIS — I429 Cardiomyopathy, unspecified: Secondary | ICD-10-CM | POA: Diagnosis present

## 2015-04-19 DIAGNOSIS — I25110 Atherosclerotic heart disease of native coronary artery with unstable angina pectoris: Secondary | ICD-10-CM | POA: Diagnosis not present

## 2015-04-19 DIAGNOSIS — I471 Supraventricular tachycardia: Secondary | ICD-10-CM

## 2015-04-19 DIAGNOSIS — Z8249 Family history of ischemic heart disease and other diseases of the circulatory system: Secondary | ICD-10-CM

## 2015-04-19 DIAGNOSIS — R079 Chest pain, unspecified: Secondary | ICD-10-CM

## 2015-04-19 DIAGNOSIS — E876 Hypokalemia: Secondary | ICD-10-CM | POA: Diagnosis present

## 2015-04-19 DIAGNOSIS — G4733 Obstructive sleep apnea (adult) (pediatric): Secondary | ICD-10-CM | POA: Diagnosis present

## 2015-04-19 DIAGNOSIS — I10 Essential (primary) hypertension: Secondary | ICD-10-CM | POA: Diagnosis present

## 2015-04-19 DIAGNOSIS — Z955 Presence of coronary angioplasty implant and graft: Secondary | ICD-10-CM

## 2015-04-19 DIAGNOSIS — F1010 Alcohol abuse, uncomplicated: Secondary | ICD-10-CM

## 2015-04-19 DIAGNOSIS — F17210 Nicotine dependence, cigarettes, uncomplicated: Secondary | ICD-10-CM | POA: Diagnosis present

## 2015-04-19 DIAGNOSIS — Z720 Tobacco use: Secondary | ICD-10-CM | POA: Diagnosis not present

## 2015-04-19 DIAGNOSIS — Z79899 Other long term (current) drug therapy: Secondary | ICD-10-CM | POA: Diagnosis not present

## 2015-04-19 HISTORY — DX: Thrombocytopenia, unspecified: D69.6

## 2015-04-19 HISTORY — PX: CARDIAC CATHETERIZATION: SHX172

## 2015-04-19 HISTORY — PX: CORONARY ANGIOPLASTY WITH STENT PLACEMENT: SHX49

## 2015-04-19 HISTORY — DX: Atherosclerotic heart disease of native coronary artery without angina pectoris: I25.10

## 2015-04-19 HISTORY — DX: Unspecified convulsions: R56.9

## 2015-04-19 HISTORY — DX: Unspecified osteoarthritis, unspecified site: M19.90

## 2015-04-19 LAB — BASIC METABOLIC PANEL
Anion gap: 5 (ref 5–15)
BUN: 10 mg/dL (ref 6–20)
CO2: 28 mmol/L (ref 22–32)
Calcium: 9.1 mg/dL (ref 8.9–10.3)
Chloride: 105 mmol/L (ref 101–111)
Creatinine, Ser: 1.17 mg/dL (ref 0.61–1.24)
GFR calc Af Amer: >60 mL/min (ref >60)
GFR calc non Af Amer: >60 mL/min (ref >60)
Glucose, Bld: 94 mg/dL (ref 65–99)
Potassium: 3.6 mmol/L (ref 3.5–5.1)
Sodium: 138 mmol/L (ref 135–145)

## 2015-04-19 LAB — CBC WITH DIFFERENTIAL/PLATELET
Basophils Absolute: 0 10*3/uL (ref 0.0–0.1)
Basophils Relative: 1 % (ref 0–1)
Eosinophils Absolute: 0.1 10*3/uL (ref 0.0–0.7)
Eosinophils Relative: 2 % (ref 0–5)
HCT: 43.1 % (ref 39.0–52.0)
Hemoglobin: 14.5 g/dL (ref 13.0–17.0)
Lymphocytes Relative: 16 % (ref 12–46)
Lymphs Abs: 0.7 10*3/uL (ref 0.7–4.0)
MCH: 31 pg (ref 26.0–34.0)
MCHC: 33.6 g/dL (ref 30.0–36.0)
MCV: 92.3 fL (ref 78.0–100.0)
Monocytes Absolute: 0.5 10*3/uL (ref 0.1–1.0)
Monocytes Relative: 11 % (ref 3–12)
Neutro Abs: 3.2 10*3/uL (ref 1.7–7.7)
Neutrophils Relative %: 71 % (ref 43–77)
Platelets: 107 10*3/uL — ABNORMAL LOW (ref 150–400)
RBC: 4.67 MIL/uL (ref 4.22–5.81)
RDW: 13.7 % (ref 11.5–15.5)
WBC: 4.5 10*3/uL (ref 4.0–10.5)

## 2015-04-19 LAB — I-STAT TROPONIN, ED: Troponin i, poc: 0.02 ng/mL (ref 0.00–0.08)

## 2015-04-19 LAB — MAGNESIUM: Magnesium: 1.8 mg/dL (ref 1.7–2.4)

## 2015-04-19 LAB — TROPONIN I
Troponin I: <0.03 ng/mL (ref <0.031)
Troponin I: <0.03 ng/mL (ref <0.031)

## 2015-04-19 LAB — TSH: TSH: 1.668 u[IU]/mL (ref 0.350–4.500)

## 2015-04-19 LAB — PROTIME-INR
INR: 0.99 (ref 0.00–1.49)
Prothrombin Time: 13.3 seconds (ref 11.6–15.2)

## 2015-04-19 LAB — POCT ACTIVATED CLOTTING TIME: Activated Clotting Time: 558 seconds

## 2015-04-19 LAB — BRAIN NATRIURETIC PEPTIDE: B Natriuretic Peptide: 150.4 pg/mL — ABNORMAL HIGH (ref 0.0–100.0)

## 2015-04-19 SURGERY — LEFT HEART CATH AND CORONARY ANGIOGRAPHY

## 2015-04-19 MED ORDER — MIDAZOLAM HCL 2 MG/2ML IJ SOLN
INTRAMUSCULAR | Status: AC
  Filled 2015-04-19: qty 4

## 2015-04-19 MED ORDER — SODIUM CHLORIDE 0.9 % IJ SOLN: 3.0000 mL | INTRAMUSCULAR | Status: DC | PRN

## 2015-04-19 MED ORDER — HEPARIN SODIUM (PORCINE) 1000 UNIT/ML IJ SOLN
INTRAMUSCULAR | Status: AC
  Filled 2015-04-19: qty 1

## 2015-04-19 MED ORDER — LABETALOL HCL 5 MG/ML IV SOLN
INTRAVENOUS | Status: AC
  Filled 2015-04-19: qty 4

## 2015-04-19 MED ORDER — FENTANYL CITRATE (PF) 100 MCG/2ML IJ SOLN
INTRAMUSCULAR | Status: DC | PRN
  Administered 2015-04-19 (×2): 50 ug via INTRAVENOUS

## 2015-04-19 MED ORDER — SODIUM CHLORIDE 0.9 % IV SOLN: 250.0000 mL | INTRAVENOUS | Status: DC | PRN

## 2015-04-19 MED ORDER — ANGIOPLASTY BOOK
Freq: Once | Status: AC
  Administered 2015-04-19: 21:00:00
  Filled 2015-04-19: qty 1

## 2015-04-19 MED ORDER — TICAGRELOR 90 MG PO TABS
90.0000 mg | ORAL_TABLET | Freq: Two times a day (BID) | ORAL | Status: DC
  Administered 2015-04-20: 05:00:00 90 mg via ORAL
  Filled 2015-04-19: qty 1

## 2015-04-19 MED ORDER — HEPARIN (PORCINE) IN NACL 100-0.45 UNIT/ML-% IJ SOLN
1200.0000 [IU]/h | INTRAMUSCULAR | Status: DC
  Administered 2015-04-19: 1200 [IU]/h via INTRAVENOUS
  Filled 2015-04-19: qty 250

## 2015-04-19 MED ORDER — SODIUM CHLORIDE 0.9 % WEIGHT BASED INFUSION
1.0000 mL/kg/h | INTRAVENOUS | Status: DC
  Administered 2015-04-19: 1 mL/kg/h via INTRAVENOUS

## 2015-04-19 MED ORDER — LORAZEPAM 0.5 MG PO TABS
1.0000 mg | ORAL_TABLET | ORAL | Status: DC | PRN
  Administered 2015-04-19 – 2015-04-20 (×2): 1 mg via ORAL
  Filled 2015-04-19 (×2): qty 2

## 2015-04-19 MED ORDER — LIDOCAINE HCL (PF) 1 % IJ SOLN
INTRAMUSCULAR | Status: AC
  Filled 2015-04-19: qty 30

## 2015-04-19 MED ORDER — ENOXAPARIN SODIUM 40 MG/0.4ML ~~LOC~~ SOLN
40.0000 mg | SUBCUTANEOUS | Status: DC
  Administered 2015-04-20: 40 mg via SUBCUTANEOUS
  Filled 2015-04-19 (×2): qty 0.4

## 2015-04-19 MED ORDER — SODIUM CHLORIDE 0.9 % IJ SOLN
3.0000 mL | Freq: Two times a day (BID) | INTRAMUSCULAR | Status: DC
  Administered 2015-04-19: 3 mL via INTRAVENOUS

## 2015-04-19 MED ORDER — MIDAZOLAM HCL 2 MG/2ML IJ SOLN
INTRAMUSCULAR | Status: DC | PRN
  Administered 2015-04-19 (×3): 1 mg via INTRAVENOUS

## 2015-04-19 MED ORDER — HEPARIN (PORCINE) IN NACL 2-0.9 UNIT/ML-% IJ SOLN
INTRAMUSCULAR | Status: AC
  Filled 2015-04-19: qty 1500

## 2015-04-19 MED ORDER — VERAPAMIL HCL 2.5 MG/ML IV SOLN
INTRAVENOUS | Status: DC | PRN
  Administered 2015-04-19: 16:00:00 via INTRA_ARTERIAL

## 2015-04-19 MED ORDER — SODIUM CHLORIDE 0.9 % WEIGHT BASED INFUSION
3.0000 mL/kg/h | INTRAVENOUS | Status: AC
  Administered 2015-04-19: 19:00:00 3 mL/kg/h via INTRAVENOUS

## 2015-04-19 MED ORDER — ONDANSETRON HCL 4 MG/2ML IJ SOLN: 4.0000 mg | Freq: Four times a day (QID) | INTRAMUSCULAR | Status: DC | PRN

## 2015-04-19 MED ORDER — ASPIRIN EC 81 MG PO TBEC: 81.0000 mg | DELAYED_RELEASE_TABLET | Freq: Every day | ORAL | Status: DC

## 2015-04-19 MED ORDER — HEPARIN BOLUS VIA INFUSION
4000.0000 [IU] | Freq: Once | INTRAVENOUS | Status: AC
  Administered 2015-04-19: 4000 [IU] via INTRAVENOUS
  Filled 2015-04-19: qty 4000

## 2015-04-19 MED ORDER — ASPIRIN 81 MG PO CHEW
81.0000 mg | CHEWABLE_TABLET | Freq: Every day | ORAL | Status: DC
  Administered 2015-04-20: 10:00:00 81 mg via ORAL
  Filled 2015-04-19: qty 1

## 2015-04-19 MED ORDER — TICAGRELOR 90 MG PO TABS
ORAL_TABLET | ORAL | Status: DC | PRN
  Administered 2015-04-19: 180 mg via ORAL

## 2015-04-19 MED ORDER — FENTANYL CITRATE (PF) 100 MCG/2ML IJ SOLN
INTRAMUSCULAR | Status: AC
  Filled 2015-04-19: qty 4

## 2015-04-19 MED ORDER — VERAPAMIL HCL 2.5 MG/ML IV SOLN
INTRAVENOUS | Status: AC
  Filled 2015-04-19: qty 2

## 2015-04-19 MED ORDER — LABETALOL HCL 5 MG/ML IV SOLN
INTRAVENOUS | Status: DC | PRN
Start: 2015-04-19 — End: 2015-04-19
  Administered 2015-04-19: 10 mg via INTRAVENOUS

## 2015-04-19 MED ORDER — NITROGLYCERIN 0.4 MG SL SUBL: 0.4000 mg | SUBLINGUAL_TABLET | SUBLINGUAL | Status: DC | PRN

## 2015-04-19 MED ORDER — BIVALIRUDIN BOLUS VIA INFUSION - CUPID
INTRAVENOUS | Status: DC | PRN
  Administered 2015-04-19: 67.65 mg via INTRAVENOUS

## 2015-04-19 MED ORDER — SODIUM CHLORIDE 0.9 % WEIGHT BASED INFUSION
3.0000 mL/kg/h | INTRAVENOUS | Status: DC
  Administered 2015-04-19: 3 mL/kg/h via INTRAVENOUS

## 2015-04-19 MED ORDER — METOPROLOL SUCCINATE ER 50 MG PO TB24: 50.0000 mg | ORAL_TABLET | Freq: Every day | ORAL | Status: DC

## 2015-04-19 MED ORDER — SODIUM CHLORIDE 0.9 % IJ SOLN: 3.0000 mL | Freq: Two times a day (BID) | INTRAMUSCULAR | Status: DC

## 2015-04-19 MED ORDER — ASPIRIN 81 MG PO CHEW
324.0000 mg | CHEWABLE_TABLET | Freq: Once | ORAL | Status: AC
  Administered 2015-04-19: 324 mg via ORAL
  Filled 2015-04-19: qty 4

## 2015-04-19 MED ORDER — ZOLPIDEM TARTRATE 5 MG PO TABS: 5.0000 mg | ORAL_TABLET | Freq: Every evening | ORAL | Status: DC | PRN

## 2015-04-19 MED ORDER — BIVALIRUDIN 250 MG IV SOLR
INTRAVENOUS | Status: AC
  Filled 2015-04-19: qty 250

## 2015-04-19 MED ORDER — METOPROLOL TARTRATE 25 MG PO TABS
25.0000 mg | ORAL_TABLET | Freq: Two times a day (BID) | ORAL | Status: AC
  Administered 2015-04-19 (×2): 25 mg via ORAL
  Filled 2015-04-19 (×2): qty 1

## 2015-04-19 MED ORDER — TICAGRELOR 90 MG PO TABS
ORAL_TABLET | ORAL | Status: AC
Start: 2015-04-19 — End: 2015-04-19
  Filled 2015-04-19: qty 2

## 2015-04-19 MED ORDER — NITROGLYCERIN 1 MG/10 ML FOR IR/CATH LAB
INTRA_ARTERIAL | Status: AC
  Filled 2015-04-19: qty 10

## 2015-04-19 MED ORDER — IOHEXOL 350 MG/ML SOLN
INTRAVENOUS | Status: DC | PRN
  Administered 2015-04-19: 165 mL via INTRACARDIAC

## 2015-04-19 MED ORDER — NICOTINE 14 MG/24HR TD PT24
14.0000 mg | MEDICATED_PATCH | Freq: Every day | TRANSDERMAL | Status: DC
  Administered 2015-04-19 – 2015-04-20 (×2): 14 mg via TRANSDERMAL
  Filled 2015-04-19 (×2): qty 1

## 2015-04-19 MED ORDER — HEPARIN SODIUM (PORCINE) 1000 UNIT/ML IJ SOLN
INTRAMUSCULAR | Status: DC | PRN
  Administered 2015-04-19: 5000 [IU] via INTRAVENOUS

## 2015-04-19 MED ORDER — LIDOCAINE HCL (PF) 1 % IJ SOLN
INTRAMUSCULAR | Status: DC | PRN
  Administered 2015-04-19: 17:00:00

## 2015-04-19 MED ORDER — ACETAMINOPHEN 325 MG PO TABS
650.0000 mg | ORAL_TABLET | ORAL | Status: DC | PRN
  Administered 2015-04-19 – 2015-04-20 (×3): 650 mg via ORAL
  Filled 2015-04-19 (×3): qty 2

## 2015-04-19 MED ORDER — SODIUM CHLORIDE 0.9 % IV SOLN
250.0000 mg | INTRAVENOUS | Status: DC | PRN
  Administered 2015-04-19: 1.75 mg/kg/h via INTRAVENOUS

## 2015-04-19 MED ORDER — ACETAMINOPHEN 325 MG PO TABS: 650.0000 mg | ORAL_TABLET | ORAL | Status: DC | PRN

## 2015-04-19 SURGICAL SUPPLY — 15 items
BALLN EMERGE MR 3.0X12 (BALLOONS) ×3
BALLOON EMERGE MR 3.0X12 (BALLOONS) IMPLANT
CATH INFINITI 5 FR JL3.5 (CATHETERS) ×3 IMPLANT
CATH INFINITI JR4 5F (CATHETERS) ×3 IMPLANT
CATH VISTA GUIDE 6FR XB3.5 (CATHETERS) ×2 IMPLANT
DEVICE RAD COMP TR BAND LRG (VASCULAR PRODUCTS) ×3 IMPLANT
GLIDESHEATH SLEND A-KIT 6F 22G (SHEATH) ×3 IMPLANT
KIT ENCORE 26 ADVANTAGE (KITS) ×2 IMPLANT
KIT HEART LEFT (KITS) ×3 IMPLANT
PACK CARDIAC CATHETERIZATION (CUSTOM PROCEDURE TRAY) ×3 IMPLANT
STENT SYNERGY DES 3.5X12 (Permanent Stent) ×2 IMPLANT
TRANSDUCER W/STOPCOCK (MISCELLANEOUS) ×3 IMPLANT
TUBING CIL FLEX 10 FLL-RA (TUBING) ×3 IMPLANT
WIRE ASAHI PROWATER 180CM (WIRE) ×2 IMPLANT
WIRE SAFE-T 1.5MM-J .035X260CM (WIRE) ×3 IMPLANT

## 2015-04-19 NOTE — ED Notes (Signed)
Cards at bedside

## 2015-04-19 NOTE — Progress Notes (Signed)
ANTICOAGULATION CONSULT NOTE - Initial Consult  Pharmacy Consult for heparin Indication: chest pain/ACS  No Known Allergies  Patient Measurements: Height: 6' (182.9 cm) Weight: 198 lb 14.4 oz (90.22 kg) IBW/kg (Calculated) : 77.6 Heparin Dosing Weight: 90.2kg  Vital Signs: Temp: 98.4 F (36.9 C) (08/29 1305) Temp Source: Oral (08/29 1305) BP: 137/83 mmHg (08/29 1305) Pulse Rate: 73 (08/29 1305)  Labs:  Recent Labs  04/19/15 0934  HGB 14.5  HCT 43.1  PLT 107*  CREATININE 1.17    Estimated Creatinine Clearance: 78.3 mL/min (by C-G formula based on Cr of 1.17).   Medical History: Past Medical History  Diagnosis Date  . Hypertension   . Sleep apnea   . SVT (supraventricular tachycardia)   . Thrombocytopenia     Assessment: Pharmacy consulted to dose heparin for ACS/CP (no anticoag pta). Hg wnl, plt 107 on admit (PMH of TCP). No bleed documented.   Goal of Therapy:  Heparin level 0.3-0.7 units/ml Monitor platelets by anticoagulation protocol: Yes   Plan:  Heparin 4000u bolus Heparin IV @ 1200 units/h 6h HL, daily HL/CBC Mon s/sx bleeding Mon plt trend  Babs Bertin, PharmD Clinical Pharmacist Pager [phone] 04/19/2015 1:26 PM

## 2015-04-19 NOTE — ED Provider Notes (Signed)
CSN: [id number]     Arrival date & time 04/19/15  2952 History   First MD Initiated Contact with Patient 04/19/15 0900     Chief Complaint  Patient presents with  . Chest Pain  . Shortness of Breath     (Consider location/radiation/quality/duration/timing/severity/associated sxs/prior Treatment) Patient is a 54 y.o. male presenting with chest pain and shortness of breath. The history is provided by the patient and medical records.  Chest Pain Associated symptoms: shortness of breath   Shortness of Breath Associated symptoms: chest pain     This is a 54 y.o. M with hx of HTN and episode of SVT last year, presenting to the ED for chest pain and SOB.  Patient states for the past 2-3 weeks he has been having intermittent chest pain and SOB, worse with exertional activities.  He states pain will radiate down into both arms.  He also experiences some lightheadedness and generalized weakness when this occurs.  Patient works as a Education administrator, states he has to stop working and rest up to 10x daily due to chest pain.  Aside from SVT, no prior cardiac hx.  Family hx of CAD and CVA, no hx of MI.  Patient is a daily smoker, approx 0.5 PPD x 30 years.  Patient denies CP on arrival to ED.  Patient followed by cardiology, no prior cardiac cath, stress test, or echo.  VSS.    Past Medical History  Diagnosis Date  . Hypertension   . Sleep apnea   . SVT (supraventricular tachycardia)    History reviewed. No pertinent past surgical history. Family History  Problem Relation Age of Onset  . Hypertension Father    Social History  Substance Use Topics  . Smoking status: Current Every Day Smoker -- 0.50 packs/day for 30 years    Types: Cigarettes  . Smokeless tobacco: None  . Alcohol Use: 4.8 oz/week    8 Cans of beer per week    Review of Systems  Respiratory: Positive for shortness of breath.   Cardiovascular: Positive for chest pain.  All other systems reviewed and are negative.     Allergies   Review of patient's allergies indicates no known allergies.  Home Medications   Prior to Admission medications   Medication Sig Start Date End Date Taking? Authorizing Provider  diltiazem (CARDIZEM CD) 240 MG 24 hr capsule Take 1 capsule (240 mg total) by mouth daily. 05/18/14   Vesta Mixer, MD  ibuprofen (ADVIL,MOTRIN) 100 MG tablet Take 100 mg by mouth every 6 (six) hours as needed for fever.    Historical Provider, MD  lisinopril (PRINIVIL,ZESTRIL) 20 MG tablet TAKE 1 TABLET (20 MG TOTAL) BY MOUTH DAILY. 03/08/15   Vesta Mixer, MD  propranolol (INDERAL) 10 MG tablet Take 1 tablet (10 mg total) by mouth 4 (four) times daily as needed (for SVT). 12/01/13   Vesta Mixer, MD   BP 174/94 mmHg  Pulse 97  Temp(Src) 98.1 F (36.7 C) (Oral)  Resp 18  SpO2 98%   Physical Exam  Constitutional: He is oriented to person, place, and time. He appears well-developed and well-nourished. No distress.  HENT:  Head: Normocephalic and atraumatic.  Mouth/Throat: Oropharynx is clear and moist.  Eyes: Conjunctivae and EOM are normal. Pupils are equal, round, and reactive to light.  Neck: Normal range of motion. Neck supple.  Cardiovascular: Normal rate, regular rhythm and normal heart sounds.   Pulmonary/Chest: Effort normal and breath sounds normal. No respiratory distress. He has  no wheezes.  Abdominal: Soft. Bowel sounds are normal. There is no tenderness. There is no guarding.  Musculoskeletal: Normal range of motion. He exhibits no edema.  No pitting edema  Neurological: He is alert and oriented to person, place, and time.  Skin: Skin is warm and dry. He is not diaphoretic.  Psychiatric: He has a normal mood and affect.  Nursing note and vitals reviewed.   ED Course  Procedures (including critical care time) Labs Review Labs Reviewed  CBC WITH DIFFERENTIAL/PLATELET - Abnormal; Notable for the following:    Platelets 107 (*)    All other components within normal limits  BRAIN  NATRIURETIC PEPTIDE - Abnormal; Notable for the following:    B Natriuretic Peptide 150.4 (*)    All other components within normal limits  BASIC METABOLIC PANEL  I-STAT TROPOININ, ED    Imaging Review Dg Chest 2 View  04/19/2015   CLINICAL DATA:  Chest pain, shortness of breath  EXAM: CHEST  2 VIEW  COMPARISON:  November 20, 2013  FINDINGS: The heart size and mediastinal contours are stable. Heart size is mildly enlarged. The aorta is tortuous. There is mild diffuse increased pulmonary interstitial markings bilaterally. There is no focal pneumonia or pleural effusion. Degenerative joint changes of the spine are identified.  IMPRESSION: Diffuse increased pulmonary interstitial markings, this can be seen in bronchitis/viral infection.  No focal pneumonia.   Electronically Signed   By: Sherian Rein M.D.   On: 04/19/2015 09:54   I have personally reviewed and evaluated these images and lab results as part of my medical decision-making.   EKG Interpretation   Date/Time:  Monday April 19 2015 08:50:17 EDT Ventricular Rate:  103 PR Interval:  178 QRS Duration: 98 QT Interval:  360 QTC Calculation: 471 R Axis:   -13 Text Interpretation:  Sinus tachycardia Incomplete right bundle branch  block Cannot rule out Anterior infarct , age undetermined No significant  change since last tracing Confirmed by [facility]  MD, BLAIR [phone]) on  04/19/2015 9:02:11 AM      MDM   Final diagnoses:  Unstable angina  Chest pain, unspecified chest pain type   54 year old male with intermittent, exertional chest pain and shortness of breath over the past several weeks.  Symptoms are very concerning for unstable angina. Patient given a full dose aspirin. Will obtain labs, chest x-ray. We'll plan to admit to cardiology.  labwork is reassuring, troponin negative. Chest x-ray with bronchitic changes, no focal infiltrate. Patient has chronic cough unchanged from baseline, no fever or chills. Case discussed with  cardiology, will admit for further management.  Garlon Hatchet, PA-C 04/19/15 1433  Elwin Mocha, MD 04/19/15 [phone]

## 2015-04-19 NOTE — ED Notes (Signed)
Pt sts CP and SOB x 4 weeks worse with exertion; pt sts some htn with hx of same; pt sts pain is mid sternal

## 2015-04-19 NOTE — ED Notes (Signed)
Patient transported to X-ray 

## 2015-04-19 NOTE — Progress Notes (Signed)
Pt irritable and restless, already got out of bed and put jeans on.  Family leaving to get car then plan to return to stay overnight.  Daughter states he's mad that they made him come to the ER.  BP 177/117. Rt radial TR band level 0.  Reminded pt need to protect rt wrist due to high risk of major bleeding from artery. Post radial cath instructions given and reviewed w/ pt and family.  Pt voices understanding.  Tylenol given for wrist discomfort 2/10 and Ativan given PO for anxiety. Pt denies desire for nicotine patch.  Reminded pt not to get up without assist due to risk of fall from wires and meds.  Voiced understanding but bed alarm turned on for safety.  Will monitor closely.

## 2015-04-19 NOTE — Interval H&P Note (Signed)
History and Physical Interval Note:  04/19/2015 3:23 PM This is an updated H&P and shall replace the one done previously at 3:20 PM. The patient has clinical unstable angina. Please see below. Cath Lab Visit (complete for each Cath Lab visit)  Clinical Evaluation Leading to the Procedure:   ACS: Yes.    Non-ACS:    Anginal Classification: CCS III  Anti-ischemic medical therapy: No Therapy  Non-Invasive Test Results: No non-invasive testing performed  Prior CABG: No previous CABG       Wyatt Santiago  has presented today for surgery, with the diagnosis of cp  The various methods of treatment have been discussed with the patient and family. After consideration of risks, benefits and other options for treatment, the patient has consented to  Procedure(s): Left Heart Cath and Coronary Angiography (N/A) as a surgical intervention .  The patient's history has been reviewed, patient examined, no change in status, stable for surgery.  I have reviewed the patient's chart and labs.  Questions were answered to the patient's satisfaction.     Lesleigh Noe

## 2015-04-19 NOTE — ED Notes (Signed)
Pt's daughter reports pt is an alcoholic and hasn't had anything to drink since Friday 8/26.

## 2015-04-19 NOTE — Interval H&P Note (Signed)
Cath Lab Visit (complete for each Cath Lab visit)  Clinical Evaluation Leading to the Procedure:   ACS: No.  Non-ACS:    Anginal Classification: CCS III  Anti-ischemic medical therapy: No Therapy  Non-Invasive Test Results: No non-invasive testing performed  Prior CABG: No previous CABG      History and Physical Interval Note:  04/19/2015 3:20 PM  Wyatt Santiago  has presented today for surgery, with the diagnosis of cp  The various methods of treatment have been discussed with the patient and family. After consideration of risks, benefits and other options for treatment, the patient has consented to  Procedure(s): Left Heart Cath and Coronary Angiography (N/A) as a surgical intervention .  The patient's history has been reviewed, patient examined, no change in status, stable for surgery.  I have reviewed the patient's chart and labs.  Questions were answered to the patient's satisfaction.     Lesleigh Noe

## 2015-04-19 NOTE — H&P (Signed)
History and Physical   Patient ID: Wyatt Santiago MRN: [id number], DOB/AGE: 04-20-1961 54 y.o. Date of Encounter: 04/19/2015  Primary Physician: Cassell Smiles., MD Primary Cardiologist: Nahser  Chief Complaint:  Chest pain  HPI: Wyatt Santiago is a 54 y.o. male with a history of SVT, HTN, OSA (not on CPAP), ETOH use, and tobacco use.    He has a history of intermittent chest pain, he would get episodes about once a week. They would resolve without any intervention and he has never been evaluated for these.   He had SVT in 2015 or so, and went to [facility]. He converted with adenosine. After that, he was followed by Dr. Elease Hashimoto and started on daily Cardizem and when necessary propanolol, but has not been taking either. He has not had any recent palpitations or presyncope.  About 3 weeks ago, his chest pain episodes became more frequent. They have progressed to the point that he is getting multiple episodes daily. They'll start with exertion. The pain starts in the middle of his chest and as a pressure. It reaches a 9/10. It radiates up into both shoulders and goes down his arms. It is associated with shortness of breath and occasionally diaphoresis. He will rest and the symptoms will resolve in 30 minutes or less. He has been limiting his activity to try and keep from having the pain, but he has still been getting episodes at least 2 or 3 times a day. 2 days ago, he had the worst episode, it lasted the longest and was the most severe.  Today, he had an episode with much less activity than usual and was home. His family insisted he come to the emergency room. In the emergency room, he is pain-free.  Past Medical History  Diagnosis Date  . Hypertension   . Sleep apnea   . SVT (supraventricular tachycardia)   . Thrombocytopenia     Surgical History:  Past Surgical History  Procedure Laterality Date  . None      I have reviewed the patient's current medications. Prior  to Admission medications   Medication Sig Start Date End Date Taking? Authorizing Provider  acetaminophen (TYLENOL) 325 MG tablet Take 650 mg by mouth every 6 (six) hours as needed for mild pain.   Yes Historical Provider, MD  lisinopril (PRINIVIL,ZESTRIL) 20 MG tablet TAKE 1 TABLET (20 MG TOTAL) BY MOUTH DAILY. 03/08/15  Yes Vesta Mixer, MD  diltiazem (CARDIZEM CD) 240 MG 24 hr capsule Take 1 capsule (240 mg total) by mouth daily. Patient not taking: Reported on 04/19/2015 05/18/14   Vesta Mixer, MD  propranolol (INDERAL) 10 MG tablet Take 1 tablet (10 mg total) by mouth 4 (four) times daily as needed (for SVT). Patient not taking: Reported on 04/19/2015 12/01/13   Vesta Mixer, MD   Allergies: No Known Allergies  Social History   Social History  . Marital Status: Married    Spouse Name: N/A  . Number of Children: N/A  . Years of Education: N/A   Occupational History  . Auto Painter    Social History Main Topics  . Smoking status: Current Every Day Smoker -- 1.00 packs/day for 40 years    Types: Cigarettes  . Smokeless tobacco: Never Used  . Alcohol Use: 33.6 oz/week    56 Cans of beer per week     Comment: Drinks about a 8-9 beers/day.  . Drug Use: No  . Sexual Activity: Not on  file   Other Topics Concern  . Not on file   Social History Narrative   Lives in [place], [place]    Family History  Problem Relation Age of Onset  . Hypertension Father    Family Status  Relation Status Death Age  . Mother Alive   . Father Alive     stroke     Review of Systems:   Full 14-point review of systems otherwise negative except as noted above.  Physical Exam: Blood pressure 147/77, pulse 74, temperature 98.1 F (36.7 C), temperature source Oral, resp. rate 14, SpO2 95 %. General: Well developed, well nourished,male in no acute distress. Head: Normocephalic, atraumatic, sclera non-icteric, no xanthomas, nares are without discharge. Dentition: good Neck: No carotid bruits.  JVD not elevated. No thyromegally Lungs: Good expansion bilaterally. without wheezes or rhonchi. Rales bases Heart: Regular rate and rhythm with S1 S2.  No S3 or S4.  Soft murmur, no rubs, or gallops appreciated. Abdomen: Soft, non-tender, non-distended with normoactive bowel sounds. No hepatomegaly. No rebound/guarding. No obvious abdominal masses. Msk:  Strength and tone appear normal for age. No joint deformities or effusions, no spine or costo-vertebral angle tenderness. Extremities: No clubbing or cyanosis. No edema.  Distal pedal pulses are 2+ in 4 extrem Neuro: Alert and oriented X 3. Moves all extremities spontaneously. No focal deficits noted. Psych:  Responds to questions appropriately with a normal affect. Skin: No rashes or lesions noted  Labs:   Lab Results  Component Value Date   WBC 4.5 04/19/2015   HGB 14.5 04/19/2015   HCT 43.1 04/19/2015   MCV 92.3 04/19/2015   PLT 107* 04/19/2015    Recent Labs Lab 04/19/15 0934  NA 138  K 3.6  CL 105  CO2 28  BUN 10  CREATININE 1.17  CALCIUM 9.1  GLUCOSE 94    Recent Labs  04/19/15 0944  TROPIPOC 0.02   B NATRIURETIC PEPTIDE  Date/Time Value Ref Range Status  04/19/2015 09:34 AM 150.4* 0.0 - 100.0 pg/mL Final   Radiology/Studies: Dg Chest 2 View 04/19/2015   CLINICAL DATA:  Chest pain, shortness of breath  EXAM: CHEST  2 VIEW  COMPARISON:  November 20, 2013  FINDINGS: The heart size and mediastinal contours are stable. Heart size is mildly enlarged. The aorta is tortuous. There is mild diffuse increased pulmonary interstitial markings bilaterally. There is no focal pneumonia or pleural effusion. Degenerative joint changes of the spine are identified.  IMPRESSION: Diffuse increased pulmonary interstitial markings, this can be seen in bronchitis/viral infection.  No focal pneumonia.   Electronically Signed   By: Sherian Rein M.D.   On: 04/19/2015 09:54   ECG: 04/19/2015 SR, No acute changes  ASSESSMENT AND PLAN:    Principal Problem:   Unstable angina pectoris Active Problems:   SVT (supraventricular tachycardia)   HTN (hypertension)   Obstructive sleep apnea   Alcohol abuse   Cigarette smoker  Signed, Leanna Battles 04/19/2015 11:35 AM Beeper [phone]   The patient was seen, examined and discussed with Theodore Demark, PA-C and I agree with the above.   54 year old male ongoing etoh abuse, 30 year of 1 PPD of smoking, poorly controlled HTN who presented with a typical exertional chest pain for the last 2 weeks that is progressively worsening. His first troponin is negative, ECG shows sinus tachycardia. We will schedule him for a cath, start DT precaution with ativan.   Lars Masson 04/19/2015

## 2015-04-20 ENCOUNTER — Encounter (HOSPITAL_COMMUNITY): Payer: Self-pay | Admitting: Interventional Cardiology

## 2015-04-20 LAB — LIPID PANEL
Cholesterol: 160 mg/dL (ref 0–200)
HDL: 31 mg/dL — ABNORMAL LOW (ref >40)
LDL Cholesterol: 82 mg/dL (ref 0–99)
Total CHOL/HDL Ratio: 5.2 RATIO
Triglycerides: 233 mg/dL — ABNORMAL HIGH (ref <150)
VLDL: 47 mg/dL — ABNORMAL HIGH (ref 0–40)

## 2015-04-20 LAB — COMPREHENSIVE METABOLIC PANEL
ALT: 13 U/L — ABNORMAL LOW (ref 17–63)
AST: 16 U/L (ref 15–41)
Albumin: 3.1 g/dL — ABNORMAL LOW (ref 3.5–5.0)
Alkaline Phosphatase: 87 U/L (ref 38–126)
Anion gap: 6 (ref 5–15)
BUN: 9 mg/dL (ref 6–20)
CO2: 25 mmol/L (ref 22–32)
Calcium: 8.2 mg/dL — ABNORMAL LOW (ref 8.9–10.3)
Chloride: 103 mmol/L (ref 101–111)
Creatinine, Ser: 0.94 mg/dL (ref 0.61–1.24)
GFR calc Af Amer: >60 mL/min (ref >60)
GFR calc non Af Amer: >60 mL/min (ref >60)
Glucose, Bld: 123 mg/dL — ABNORMAL HIGH (ref 65–99)
Potassium: 3.1 mmol/L — ABNORMAL LOW (ref 3.5–5.1)
Sodium: 134 mmol/L — ABNORMAL LOW (ref 135–145)
Total Bilirubin: 0.7 mg/dL (ref 0.3–1.2)
Total Protein: 6 g/dL — ABNORMAL LOW (ref 6.5–8.1)

## 2015-04-20 LAB — CBC
HCT: 38.7 % — ABNORMAL LOW (ref 39.0–52.0)
Hemoglobin: 12.6 g/dL — ABNORMAL LOW (ref 13.0–17.0)
MCH: 29.9 pg (ref 26.0–34.0)
MCHC: 32.6 g/dL (ref 30.0–36.0)
MCV: 91.7 fL (ref 78.0–100.0)
Platelets: 93 10*3/uL — ABNORMAL LOW (ref 150–400)
RBC: 4.22 MIL/uL (ref 4.22–5.81)
RDW: 13.9 % (ref 11.5–15.5)
WBC: 3.7 10*3/uL — ABNORMAL LOW (ref 4.0–10.5)

## 2015-04-20 LAB — HEMOGLOBIN A1C
Hgb A1c MFr Bld: 5.6 % (ref 4.8–5.6)
Mean Plasma Glucose: 114 mg/dL

## 2015-04-20 LAB — TROPONIN I: Troponin I: 0.08 ng/mL — ABNORMAL HIGH (ref <0.031)

## 2015-04-20 MED ORDER — ATORVASTATIN CALCIUM 40 MG PO TABS: 40.0000 mg | ORAL_TABLET | Freq: Every day | ORAL | Status: DC

## 2015-04-20 MED ORDER — METOPROLOL TARTRATE 25 MG PO TABS
25.0000 mg | ORAL_TABLET | Freq: Two times a day (BID) | ORAL | Status: DC
Start: 2015-04-20 — End: 2015-04-20
  Administered 2015-04-20: 10:00:00 25 mg via ORAL
  Filled 2015-04-20: qty 1

## 2015-04-20 MED ORDER — POTASSIUM CHLORIDE CRYS ER 20 MEQ PO TBCR: 40.0000 meq | EXTENDED_RELEASE_TABLET | Freq: Once | ORAL | Status: DC

## 2015-04-20 MED ORDER — ASPIRIN 81 MG PO CHEW: 81.0000 mg | CHEWABLE_TABLET | Freq: Every day | ORAL | Status: AC

## 2015-04-20 MED ORDER — METOPROLOL TARTRATE 25 MG PO TABS: 25.0000 mg | ORAL_TABLET | Freq: Two times a day (BID) | ORAL | Status: DC

## 2015-04-20 MED ORDER — TICAGRELOR 90 MG PO TABS: 90.0000 mg | ORAL_TABLET | Freq: Two times a day (BID) | ORAL | Status: DC

## 2015-04-20 MED ORDER — NITROGLYCERIN 0.4 MG SL SUBL: 0.4000 mg | SUBLINGUAL_TABLET | SUBLINGUAL | Status: DC | PRN

## 2015-04-20 MED ORDER — POTASSIUM CHLORIDE CRYS ER 20 MEQ PO TBCR
40.0000 meq | EXTENDED_RELEASE_TABLET | Freq: Once | ORAL | Status: AC
  Administered 2015-04-20: 40 meq via ORAL
  Filled 2015-04-20: qty 2

## 2015-04-20 NOTE — Progress Notes (Addendum)
CM spoke with pt regarding Brilinta and provided pt with booklet with 30 day free card and copay card enclosed. Pt verbally stated understanding of card usage. CM called [facility] and confirmed medication is in stock, pt made aware. No other needs identified @ present time. Gae Gallop RN,BSN,CM [phone]

## 2015-04-20 NOTE — Progress Notes (Signed)
CARDIAC REHAB PHASE I   PRE:  Rate/Rhythm: 78 SR    BP: sitting 154/80    SaO2: 98 RA  MODE:  Ambulation: 450 ft   POST:  Rate/Rhythm: 88 SR    BP: sitting 175/91     SaO2:   Tolerated well except BP elevated. Ed completed with pt and wife. Pt quiet, difficult to assess how receptive. Discussed importance of smoking cessation and ETOH cessation/reduction. Pt understands importance of Brilinta. Will refer to [facility] however pt is unsure he can get away from work. [phone]   Wyatt Santiago [place]ba CES, ACSM 04/20/2015 9:33 AM

## 2015-04-20 NOTE — Progress Notes (Signed)
TR BAND REMOVAL  LOCATION:    right radial  DEFLATED PER PROTOCOL:    Yes.    TIME BAND OFF / DRESSING APPLIED:    21:05   SITE UPON ARRIVAL:    Level 0  SITE AFTER BAND REMOVAL:    Level 0  REVERSE ALLEN'S TEST:     positive  CIRCULATION SENSATION AND MOVEMENT:    Within Normal Limits   Yes.    COMMENTS:

## 2015-04-20 NOTE — Progress Notes (Signed)
Patient Name: Wyatt Santiago Date of Encounter: 04/20/2015   SUBJECTIVE  Feels well. Denies chest pain, sob or palpitation.   CURRENT MEDS . aspirin  81 mg Oral Daily  . enoxaparin (LOVENOX) injection  40 mg Subcutaneous Q24H  . metoprolol succinate  50 mg Oral Daily  . nicotine  14 mg Transdermal Daily  . sodium chloride  3 mL Intravenous Q12H  . sodium chloride  3 mL Intravenous Q12H  . ticagrelor  90 mg Oral BID    OBJECTIVE  Filed Vitals:   04/20/15 0000 04/20/15 0401 04/20/15 0518 04/20/15 0800  BP: 144/77 179/89 165/79 154/80  Pulse:  74  73  Temp:  98.6 F (37 C)  98.3 F (36.8 C)  TempSrc:  Oral  Oral  Resp: Height:      Weight:  206 lb 9.1 oz (93.7 kg)    SpO2: 97% 99% 97% 97%    Intake/Output Summary (Last 24 hours) at 04/20/15 0837 Last data filed at 04/19/15 2300  Gross per 24 hour  Intake 969.05 ml  Output   1000 ml  Net -30.95 ml   Filed Weights   04/19/15 1305 04/20/15 0401  Weight: 198 lb 14.4 oz (90.22 kg) 206 lb 9.1 oz (93.7 kg)    PHYSICAL EXAM  General: Pleasant, NAD. Neuro: Alert and oriented X 3. Moves all extremities spontaneously. Psych: Normal affect. HEENT:  Normal  Neck: Supple without bruits or JVD. Lungs:  Resp regular and unlabored, CTA. Heart: RRR no s3, s4, or murmurs. Abdomen: Soft, non-tender, non-distended, BS + x 4.  Extremities: No clubbing, cyanosis or edema. DP/PT/Radials 2+ and equal bilaterally. Right radial cath site without hematoma or bruit.  Accessory Clinical Findings  CBC  Recent Labs  04/19/15 0934 04/20/15 0111  WBC 4.5 3.7*  NEUTROABS 3.2  --   HGB 14.5 12.6*  HCT 43.1 38.7*  MCV 92.3 91.7  PLT 107* 93*   Basic Metabolic Panel  Recent Labs  04/19/15 0934 04/19/15 1336 04/20/15 0111  NA 138  --  134*  K 3.6  --  3.1*  CL 105  --  103  CO2 28  --  25  GLUCOSE 94  --  123*  BUN 10  --  9  CREATININE 1.17  --  0.94  CALCIUM 9.1  --  8.2*  MG  --  1.8  --    Liver  Function Tests  Recent Labs  04/20/15 0111  AST 16  ALT 13*  ALKPHOS 87  BILITOT 0.7  PROT 6.0*  ALBUMIN 3.1*   Cardiac Enzymes  Recent Labs  04/19/15 1336 04/19/15 2013 04/20/15 0111  TROPONINI <0.03 <0.03 0.08*   BNP Invalid input(s): POCBNP D-Dimer No results for input(s): DDIMER in the last 72 hours. Hemoglobin A1C  Recent Labs  04/19/15 1336  HGBA1C 5.6   Fasting Lipid Panel  Recent Labs  04/20/15 0111  CHOL 160  HDL 31*  LDLCALC 82  TRIG 161*  CHOLHDL 5.2   Thyroid Function Tests  Recent Labs  04/19/15 1336  TSH 1.668    TELE  NSR  Radiology/Studies  Dg Chest 2 View  04/19/2015   CLINICAL DATA:  Chest pain, shortness of breath  EXAM: CHEST  2 VIEW  COMPARISON:  November 20, 2013  FINDINGS: The heart size and mediastinal contours are stable. Heart size is mildly enlarged. The aorta is tortuous. There is mild diffuse increased pulmonary interstitial markings bilaterally. There is  no focal pneumonia or pleural effusion. Degenerative joint changes of the spine are identified.  IMPRESSION: Diffuse increased pulmonary interstitial markings, this can be seen in bronchitis/viral infection.  No focal pneumonia.   Electronically Signed   By: Sherian Rein M.D.   On: 04/19/2015 09:54   Cath 04/19/15 Conclusion    1. Mid LAD to Dist LAD lesion, 40% stenosed. 2. Mid RCA to Dist RCA lesion, 20% stenosed. 3. Ost 1st Mrg lesion, 40% stenosed. 4. Mid Cx-2 lesion, 50% stenosed. 5. Prox Cx lesion, 90% stenosed. There is a 0% residual stenosis post intervention. 6. A drug-eluting stent was placed.   Unstable angina pectoris due to high-grade obstruction in the proximal circumflex with in a tortuous segment.  Successful PCI with Synergy drug-eluting stent implantation and reduction in 90% stenosis to 0% with TIMI grade 3 flow.  Mild residual first obtuse marginal and mid circumflex obstruction less than 50%. Less than 50% obstruction in the mid LAD.  Overall  mildly reduced LV function with mid anterior wall hypokinesis and estimated ejection fraction 45%.    RECOMMENDATIONS:   Dual antiplatelet therapy (should probably switch to clopidogrel and aspirin after one month). For the time being and should remain on Brilinta and aspirin.  Potentially able to discontinue gap therapy after 6 months using Synergy.  Potentially eligible for discharge in a.m. assuming no complications.  Ethanol abuse needs to be addressed with the patient and consideration of a plan for cessation instituted.     ASSESSMENT AND PLAN 1. Utable angina pectoris - S/p PCI and DES to prox LCx with LV EF of 45%. DAPT with ASA and Brilinta. Switch to plavix and ASA after one month.  - Addressed importance cessation of alcohol. Patient willing to quit.  - He is not on statin therapy. Will add lipitor 40mg .   -04/20/2015: Cholesterol 160; HDL 31*; LDL Cholesterol 82; Triglycerides 233*; VLDL 47*    2. SVT (supraventricular tachycardia) - continue BB 3. HTN (hypertension) - relatively stable. Lisinopril 20mg  - resume on discharge. He is on Inderal 10mg  at home, will switch to metoprolol 25mg  BID. Will Stop Cardizem CD.  4. Alcohol abuse - as above 5. Cigarette smoker- Encouraged smoking cessation. 6. Hypokalemia - K of 3.1. Will give Kdur once.    Signed, Bhagat,Bhavinkumar PA-C Pager [phone]  History and all data above reviewed. No chest pain.  No SOB.   Patient examined.  I agree with the findings as above.  The patient exam reveals COR:RRR  ,  Lungs: Clear  ,  Abd: Positive bowel sounds, no rebound no guarding , Ext Right wrist without bruising or erythema.  .  All available labs, radiology testing, previous records reviewed. Agree with documented assessment and plan.  Unstable angina:  Post DES.  Home with meds as listed above.  I discussed at length risk reduction.  He has severe ongoing risk factors.  Cardiomyopathy:  EF is mildly reduced.  Follow up with an  out patient echo.  He will be on ACE and beta blocker.  Tobacco:  He was educated.  He does not want anything to try to quit.  Risk reduction:  Start stating before discharge.  He lives in [place] and wants to follow up there.      Rollene Rotunda  8:59 AM  04/20/2015

## 2015-04-20 NOTE — Progress Notes (Signed)
UR COMPLETED  

## 2015-04-20 NOTE — Discharge Summary (Signed)
Discharge Summary   Patient ID: Wyatt Santiago,  MRN: [id number], DOB/AGE: 1960-11-01 54 y.o.  Admit date: 04/19/2015 Discharge date: 04/20/2015  Primary Care Provider: Cassell Smiles Primary Cardiologist: Dr. Elease Hashimoto ( patient would like to f/u at [facility] office as he lives in [place])  Discharge Diagnoses Principal Problem:   Unstable angina pectoris Active Problems:   SVT (supraventricular tachycardia)   HTN (hypertension)   Obstructive sleep apnea   Alcohol abuse   Cigarette smoker   Unstable angina   Allergies No Known Allergies  Procedures  Conclusion    1. Mid LAD to Dist LAD lesion, 40% stenosed. 2. Mid RCA to Dist RCA lesion, 20% stenosed. 3. Ost 1st Mrg lesion, 40% stenosed. 4. Mid Cx-2 lesion, 50% stenosed. 5. Prox Cx lesion, 90% stenosed. There is a 0% residual stenosis post intervention. 6. A drug-eluting stent was placed.   Unstable angina pectoris due to high-grade obstruction in the proximal circumflex with in a tortuous segment.  Successful PCI with Synergy drug-eluting stent implantation and reduction in 90% stenosis to 0% with TIMI grade 3 flow.  Mild residual first obtuse marginal and mid circumflex obstruction less than 50%. Less than 50% obstruction in the mid LAD.  Overall mildly reduced LV function with mid anterior wall hypokinesis and estimated ejection fraction 45%.    RECOMMENDATIONS:   Dual antiplatelet therapy (should probably switch to clopidogrel and aspirin after one month). For the time being and should remain on Brilinta and aspirin.  Potentially able to discontinue gap therapy after 6 months using Synergy.  Potentially eligible for discharge in a.m. assuming no complications.  Ethanol abuse needs to be addressed with the patient and consideration of a plan for cessation instituted.      History of Present Illness  Wyatt Santiago is a 54 y.o. male with a history of SVT, HTN, OSA (not on CPAP), ETOH use, and tobacco  use who presented to [facility] ED 04/19/15 for evaluation of chest pain.   He has a history of intermittent chest pain, he would get episodes about once a week. They would resolve without any intervention and he has never been evaluated for these.   He had SVT in 2015 or so, and went to [facility]. He converted with adenosine. After that, he was followed by Dr. Elease Hashimoto and started on daily Cardizem and when necessary propanolol, but has not been taking either. He has not had any recent palpitations or presyncope.  About 3 weeks ago, his chest pain episodes became more frequent. The pain starts in the middle of his chest and as a pressure. It reaches a 9/10. It radiates up into both shoulders and goes down his arms. It is associated with shortness of breath and occasionally diaphoresis. He will rest and the symptoms will resolve in 30 minutes or less. It has been getting longest and more severe, now with rest.   He presented to ED with a episode with much less activity than usual and was home. His family insisted he come to the emergency room. In the emergency room, he was pain-free. His first troponin is negative, ECG showed sinus tachycardia.  Hospital Course He was admitted with plan to do cath later in a day. He was placed on DT precaution with ativan due to ongoing alcohol abuse. Cath showed Mid LAD to Dist LAD lesion, 40% stenosed; Mid RCA to Dist RCA lesion, 20% stenosed' Ost 1st Mrg lesion, 40% stenosed; Mid Cx-2 lesion, 50% stenosed; Prox Cx lesion, 90%  stenosed. S/p PCI and DES to prox LCx with LV EF of 45%. DAPT with ASA and Brilinta. Plan to switch to plavix and ASA after one month. No further episode of chest pain. Addressed importance cessation of alcohol and tobacco. He does not want anything to try to quit. He is a high risk patient. Patient has a cardiomyopathy - EF is mildly reduced. Follow up with an outpatient echo. Lipitor 40mg  added to regimen. He is on Inderal 10mg  at home that  switched to metoprolol 25mg  BID. Will Stop Cardizem CD. He lives in [place] and wants to follow up there.  Discharge Vitals Blood pressure 154/80, pulse 73, temperature 98.3 F (36.8 C), temperature source Oral, resp. rate 18, height 6' (1.829 m), weight 206 lb 9.1 oz (93.7 kg), SpO2 97 %.  Filed Weights   04/19/15 1305 04/20/15 0401  Weight: 198 lb 14.4 oz (90.22 kg) 206 lb 9.1 oz (93.7 kg)    Labs  CBC  Recent Labs  04/19/15 0934 04/20/15 0111  WBC 4.5 3.7*  NEUTROABS 3.2  --   HGB 14.5 12.6*  HCT 43.1 38.7*  MCV 92.3 91.7  PLT 107* 93*   Basic Metabolic Panel  Recent Labs  04/19/15 0934 04/19/15 1336 04/20/15 0111  NA 138  --  134*  K 3.6  --  3.1*  CL 105  --  103  CO2 28  --  25  GLUCOSE 94  --  123*  BUN 10  --  9  CREATININE 1.17  --  0.94  CALCIUM 9.1  --  8.2*  MG  --  1.8  --    Liver Function Tests  Recent Labs  04/20/15 0111  AST 16  ALT 13*  ALKPHOS 87  BILITOT 0.7  PROT 6.0*  ALBUMIN 3.1*   Cardiac Enzymes  Recent Labs  04/19/15 1336 04/19/15 2013 04/20/15 0111  TROPONINI <0.03 <0.03 0.08*   Hemoglobin A1C  Recent Labs  04/19/15 1336  HGBA1C 5.6   Fasting Lipid Panel  Recent Labs  04/20/15 0111  CHOL 160  HDL 31*  LDLCALC 82  TRIG 409*  CHOLHDL 5.2   Thyroid Function Tests  Recent Labs  04/19/15 1336  TSH 1.668    Disposition  Pt is being discharged home today in good condition.  Follow-up Plans & Appointments  Follow-up Information    Follow up with Joni Reining, NP On 04/29/2015.   Specialties:  Nurse Practitioner, Radiology, Cardiology   Why:  @2 :50 for cardiology TCM   Contact information:   [address] [place] [address] [phone]           Discharge Instructions    Amb Referral to Cardiac Rehabilitation    Complete by:  As directed   Congestive Heart Failure: If diagnosis is Heart Failure, patient MUST meet each of the CMS criteria: 1. Left Ventricular Ejection Fraction </=  35% 2. NYHA class II-IV symptoms despite being on optimal heart failure therapy for at least 6 weeks. 3. Stable = have not had a recent (<6 weeks) or planned (<6 months) major cardiovascular hospitalization or procedure  Program Details: - Physician supervised classes - 1-3 classes per week over a 12-18 week period, generally for a total of 36 sessions  Physician Certification: I certify that the above Cardiac Rehabilitation treatment is medically necessary and is medically approved by me for treatment of this patient. The patient is willing and cooperative, able to ambulate and medically stable to participate in exercise rehabilitation.  The participant's progress and Individualized Treatment Plan will be reviewed by the Medical Director, Cardiac Rehab staff and as indicated by the Referring/Ordering Physician.  Diagnosis:  PCI     Call MD for:  redness, tenderness, or signs of infection (pain, swelling, redness, odor or green/yellow discharge around incision site)    Complete by:  As directed      Diet - low sodium heart healthy    Complete by:  As directed      Increase activity slowly    Complete by:  As directed            F/u Labs/Studies: Consider OP f/u labs 6-8 weeks given statin initiation this admission. Outpatient echo due to reduce LV EF on cath.   Discharge Medications    Medication List    STOP taking these medications        diltiazem 240 MG 24 hr capsule  Commonly known as:  CARDIZEM CD     propranolol 10 MG tablet  Commonly known as:  INDERAL      TAKE these medications        acetaminophen 325 MG tablet  Commonly known as:  TYLENOL  Take 650 mg by mouth every 6 (six) hours as needed for mild pain.     aspirin 81 MG chewable tablet  Chew 1 tablet (81 mg total) by mouth daily.  Notes to Patient:  Tomorrow 04/21/15     atorvastatin 40 MG tablet  Commonly known as:  LIPITOR  Take 1 tablet (40 mg total) by mouth daily at 6 PM.     lisinopril 20 MG  tablet  Commonly known as:  PRINIVIL,ZESTRIL  TAKE 1 TABLET (20 MG TOTAL) BY MOUTH DAILY.     metoprolol tartrate 25 MG tablet  Commonly known as:  LOPRESSOR  Take 1 tablet (25 mg total) by mouth 2 (two) times daily.     nitroGLYCERIN 0.4 MG SL tablet  Commonly known as:  NITROSTAT  Place 1 tablet (0.4 mg total) under the tongue every 5 (five) minutes x 3 doses as needed for chest pain.     ticagrelor 90 MG Tabs tablet  Commonly known as:  BRILINTA  Take 1 tablet (90 mg total) by mouth 2 (two) times daily.        Duration of Discharge Encounter   Greater than 30 minutes including physician time.  Signed, Bhagat,Bhavinkumar PA-C 04/20/2015, 1:36 PM  Patient seen and examined.  Plan as discussed in my rounding note for today and outlined above. Rollene Rotunda  04/20/2015  2:43 PM

## 2015-04-28 NOTE — Progress Notes (Signed)
Cardiology Office Note   Date:  04/29/2015   ID:  Wyatt Santiago, DOB August 24, 1960, MRN [id number]  PCP:  Cassell Smiles., MD  Cardiologist: Needs to be est. (pt requests [place] office)   Joni Reining, NP   No chief complaint on file.     History of Present Illness: Wyatt Santiago is a 54 y.o. male who presents for ongoing assessment and management of CAD with recent hospitalization in August of 2016 for chest pain, with other history to include SVT, Hypertension, ETOH abuse and OSA not on CPAP and medical non-compliance.   He had cardiac cath on day of admission revealing: Mid LAD to [facility] LAD lesion, 40% stenosed; Mid RCA to Dist RCA lesion, 20% stenosed' Ost 1st Mrg lesion, 40% stenosed; Mid Cx-2 lesion, 50% stenosed; Prox Cx lesion, 90% stenosed. S/P PCI and DES to prox LCx with LV EF of 45%. He was started on DAPT with ASA and Brilinta. He was also placed on statin, ACE and BB. He is to have OP echocardiogram after this visit.   He and his wife come today with multiple questions and concerns. He denies any further chest pain or dyspnea. He has quit smoking and is working on drinking less. He would like to go back to work.   Past Medical History  Diagnosis Date  . Hypertension   . SVT (supraventricular tachycardia)   . Thrombocytopenia   . Sleep apnea     "they say I've got it but I don't wear mask" (04/19/2015)  . Seizures     "when I was a kid"  . Arthritis     "hands" (04/19/2015)  . Coronary artery disease     a. cath 04/19/15 1.  Mid LAD to Dist LAD lesion, 40% stenosed, S/p PCI and DES to prox LCx with LV EF of 45%.    Past Surgical History  Procedure Laterality Date  . Coronary angioplasty with stent placement  04/19/2015    "1 stent"  . Eye surgery Right     "lasered for macular degeneration"  . Cardiac catheterization N/A 04/19/2015    Procedure: Left Heart Cath and Coronary Angiography;  Surgeon: Lyn Records, MD;  Location: [facility] INVASIVE CV LAB;  Service:  Cardiovascular;  Laterality: N/A;  . Cardiac catheterization N/A 04/19/2015    Procedure: Coronary Stent Intervention;  Surgeon: Lyn Records, MD;  Location: [facility] INVASIVE CV LAB;  Service: Cardiovascular;  Laterality: N/A;     Current Outpatient Prescriptions  Medication Sig Dispense Refill  . aspirin 81 MG chewable tablet Chew 1 tablet (81 mg total) by mouth daily.    Marland Kitchen atorvastatin (LIPITOR) 40 MG tablet Take 1 tablet (40 mg total) by mouth daily at 6 PM. 30 tablet 6  . lisinopril (PRINIVIL,ZESTRIL) 20 MG tablet TAKE 1 TABLET (20 MG TOTAL) BY MOUTH DAILY. 90 tablet 3  . metoprolol tartrate (LOPRESSOR) 25 MG tablet Take 1 tablet (25 mg total) by mouth 2 (two) times daily. 60 tablet 6  . nitroGLYCERIN (NITROSTAT) 0.4 MG SL tablet Place 1 tablet (0.4 mg total) under the tongue every 5 (five) minutes x 3 doses as needed for chest pain. 25 tablet 12  . ticagrelor (BRILINTA) 90 MG TABS tablet Take 1 tablet (90 mg total) by mouth 2 (two) times daily. 60 tablet 11   No current facility-administered medications for this visit.    Allergies:   Review of patient's allergies indicates no known allergies.    Social History:  The patient  reports that he has been smoking Cigarettes.  He started smoking about 43 years ago. He has a 40 pack-year smoking history. He has never used smokeless tobacco. He reports that he drinks about 37.8 oz of alcohol per week. He reports that he does not use illicit drugs.   Family History:  The patient's family history includes Hypertension in his father.    ROS: All other systems are reviewed and negative. Unless otherwise mentioned in H&P    PHYSICAL EXAM: VS:  BP 102/64 mmHg  Pulse 83  Ht 6' (1.829 m)  Wt 198 lb (89.812 kg)  BMI 26.85 kg/m2  SpO2 96% , BMI Body mass index is 26.85 kg/(m^2). GEN: Well nourished, well developed, in no acute distress HEENT: normal Neck: no JVD, carotid bruits, or masses Cardiac: RRR; no murmurs, rubs, or gallops,no edema   Respiratory: Clear to auscultation bilaterally, normal work of breathing GI: soft, nontender, nondistended, + BS MS: no deformity or atrophy Skin: warm and dry, no rash Neuro:  Strength and sensation are intact Psych: euthymic mood, full affect  Recent Labs: 04/19/2015: B Natriuretic Peptide 150.4*; Magnesium 1.8; TSH 1.668 04/20/2015: ALT 13*; BUN 9; Creatinine, Ser 0.94; Hemoglobin 12.6*; Platelets 93*; Potassium 3.1*; Sodium 134*    Lipid Panel    Component Value Date/Time   CHOL 160 04/20/2015 0111   TRIG 233* 04/20/2015 0111   HDL 31* 04/20/2015 0111   CHOLHDL 5.2 04/20/2015 0111   VLDL 47* 04/20/2015 0111   LDLCALC 82 04/20/2015 0111      Wt Readings from Last 3 Encounters:  04/29/15 198 lb (89.812 kg)  04/20/15 206 lb 9.1 oz (93.7 kg)  01/07/15 198 lb 4 oz (89.926 kg)      Other studies Reviewed: Additional studies/ records that were reviewed today include: CXR and labs.  Review of the above records demonstrates: Some mild bronchitis. He will follow up with PCP if necessary for symptoms.   ASSESSMENT AND PLAN:  1. CAD: S/P cardiac cath with DES to the proximal circumflex. He has residual disease in the LAD and RCA. He is now on DAPT. He is compliant. He remains on statin and ACE. I have answered multiple questions from the patient and his wife. I have gone over his medications.He will have repeat echo in 3 months. He can return to work but cannot lift anything over 20 lbs or work in extreme heat.Marland Kitchen He wishes to follow up in [place] as this is closer for him. He does not need a letter for work but one will bemade available if this changes.   2. Hypertension: I have rechecked his BP in the exam room and found it to be 138/88. He will continue current doses of lisinopril.   3. Hypercholesterolemia: He has been on Lipitor and is tolerating it without myalgias.; He will need to have repeat lipids and LFT's in 3 months prior to the next appt.   4. ETOH Abuse : He continues to  drink beer and is trying to stop. He requests medications to ease the anxiety and withdrawal symptoms. I have referred him to his PCP for this.   5. Tobacco abuse: He has quit smoking but continues to cough a lot. CXR did show evidence of bronchitis. If symptoms worsen, he is to see his PCP for treatment.  Current medicines are reviewed at length with the patient today.    Labs/ tests ordered today include: Lipids and LFT's and Echo.for 3 months.  No orders of the defined types  were placed in this encounter.   I have spend greater than 25 minutes with this patient and wife answering questions.   Disposition:   FU with 3 months in the [place] office. Signed, Joni Reining, NP  04/29/2015 2:48 PM    [facility] [address] [place], [place] [address] Phone: [phone]; Fax: [phone]

## 2015-04-29 ENCOUNTER — Ambulatory Visit (INDEPENDENT_AMBULATORY_CARE_PROVIDER_SITE_OTHER): Payer: BLUE CROSS/BLUE SHIELD | Admitting: Adult Health

## 2015-04-29 ENCOUNTER — Encounter: Payer: Self-pay | Admitting: Adult Health

## 2015-04-29 VITALS — BP 102/64 | HR 83 | Ht 72.0 in | Wt 198.0 lb

## 2015-04-29 DIAGNOSIS — I2510 Atherosclerotic heart disease of native coronary artery without angina pectoris: Secondary | ICD-10-CM

## 2015-04-29 DIAGNOSIS — E785 Hyperlipidemia, unspecified: Secondary | ICD-10-CM | POA: Diagnosis not present

## 2015-04-29 DIAGNOSIS — Z720 Tobacco use: Secondary | ICD-10-CM | POA: Diagnosis not present

## 2015-04-29 DIAGNOSIS — I10 Essential (primary) hypertension: Secondary | ICD-10-CM

## 2015-04-29 DIAGNOSIS — F1010 Alcohol abuse, uncomplicated: Secondary | ICD-10-CM

## 2015-04-29 NOTE — Progress Notes (Deleted)
Name: Wyatt Santiago    DOB: February 09, 1961  Age: 54 y.o.  MR#: [id number]       PCP:  Cassell Smiles., MD      Insurance: Payor: BLUE CROSS BLUE SHIELD / Plan: BCBS OTHER / Product Type: *No Product type* /   CC:   No chief complaint on file.   VS Filed Vitals:   04/29/15 1443  BP: 102/64  Pulse: 83  Height: 6' (1.829 m)  Weight: 198 lb (89.812 kg)  SpO2: 96%    Weights Current Weight  04/29/15 198 lb (89.812 kg)  04/20/15 206 lb 9.1 oz (93.7 kg)  01/07/15 198 lb 4 oz (89.926 kg)    Blood Pressure  BP Readings from Last 3 Encounters:  04/29/15 102/64  04/20/15 154/80  01/07/15 120/82     Admit date:  (Not on file) Last encounter with RMR:  Visit date not found   Allergy Review of patient's allergies indicates no known allergies.  Current Outpatient Prescriptions  Medication Sig Dispense Refill  . aspirin 81 MG chewable tablet Chew 1 tablet (81 mg total) by mouth daily.    Marland Kitchen atorvastatin (LIPITOR) 40 MG tablet Take 1 tablet (40 mg total) by mouth daily at 6 PM. 30 tablet 6  . lisinopril (PRINIVIL,ZESTRIL) 20 MG tablet TAKE 1 TABLET (20 MG TOTAL) BY MOUTH DAILY. 90 tablet 3  . metoprolol tartrate (LOPRESSOR) 25 MG tablet Take 1 tablet (25 mg total) by mouth 2 (two) times daily. 60 tablet 6  . nitroGLYCERIN (NITROSTAT) 0.4 MG SL tablet Place 1 tablet (0.4 mg total) under the tongue every 5 (five) minutes x 3 doses as needed for chest pain. 25 tablet 12  . ticagrelor (BRILINTA) 90 MG TABS tablet Take 1 tablet (90 mg total) by mouth 2 (two) times daily. 60 tablet 11   No current facility-administered medications for this visit.    Discontinued Meds:    Medications Discontinued During This Encounter  Medication Reason  . acetaminophen (TYLENOL) 325 MG tablet Error    Patient Active Problem List   Diagnosis Date Noted  . Unstable angina pectoris 04/19/2015  . Unstable angina 04/19/2015  . SVT (supraventricular tachycardia) 12/01/2013  . HTN (hypertension) 12/01/2013   . Obstructive sleep apnea 12/01/2013  . Alcohol abuse 12/01/2013  . Cigarette smoker 12/01/2013    LABS    Component Value Date/Time   NA 134* 04/20/2015 0111   NA 138 04/19/2015 0934   NA 140 12/16/2013 1109   NA 140 11/20/2013 1212   K 3.1* 04/20/2015 0111   K 3.6 04/19/2015 0934   K 3.7 12/16/2013 1109   K 3.7 11/20/2013 1212   CL 103 04/20/2015 0111   CL 105 04/19/2015 0934   CL 97 12/16/2013 1109   CL 107 11/20/2013 1212   CO2 25 04/20/2015 0111   CO2 28 04/19/2015 0934   CO2 25 12/16/2013 1109   CO2 28 11/20/2013 1212   GLUCOSE 123* 04/20/2015 0111   GLUCOSE 94 04/19/2015 0934   GLUCOSE 121* 12/16/2013 1109   GLUCOSE 127* 11/20/2013 1212   BUN 9 04/20/2015 0111   BUN 10 04/19/2015 0934   BUN 16 12/16/2013 1109   BUN 14 11/20/2013 1212   CREATININE 0.94 04/20/2015 0111   CREATININE 1.17 04/19/2015 0934   CREATININE 0.89 12/16/2013 1109   CREATININE 1.05 11/20/2013 1212   CALCIUM 8.2* 04/20/2015 0111   CALCIUM 9.1 04/19/2015 0934   CALCIUM 9.1 12/16/2013 1109   CALCIUM 8.3* 11/20/2013 1212  GFRNONAA >60 04/20/2015 0111   GFRNONAA >60 04/19/2015 0934   GFRNONAA 97 12/16/2013 1109   GFRNONAA >60 11/20/2013 1212   GFRAA >60 04/20/2015 0111   GFRAA >60 04/19/2015 0934   GFRAA 112 12/16/2013 1109   GFRAA >60 11/20/2013 1212   CMP     Component Value Date/Time   NA 134* 04/20/2015 0111   NA 140 12/16/2013 1109   NA 140 11/20/2013 1212   K 3.1* 04/20/2015 0111   K 3.7 11/20/2013 1212   CL 103 04/20/2015 0111   CL 107 11/20/2013 1212   CO2 25 04/20/2015 0111   CO2 28 11/20/2013 1212   GLUCOSE 123* 04/20/2015 0111   GLUCOSE 121* 12/16/2013 1109   GLUCOSE 127* 11/20/2013 1212   BUN 9 04/20/2015 0111   BUN 16 12/16/2013 1109   BUN 14 11/20/2013 1212   CREATININE 0.94 04/20/2015 0111   CREATININE 1.05 11/20/2013 1212   CALCIUM 8.2* 04/20/2015 0111   CALCIUM 8.3* 11/20/2013 1212   PROT 6.0* 04/20/2015 0111   PROT 7.8 11/20/2013 1212   ALBUMIN 3.1*  04/20/2015 0111   ALBUMIN 3.9 11/20/2013 1212   AST 16 04/20/2015 0111   AST 26 11/20/2013 1212   ALT 13* 04/20/2015 0111   ALT 27 11/20/2013 1212   ALKPHOS 87 04/20/2015 0111   ALKPHOS 121* 11/20/2013 1212   BILITOT 0.7 04/20/2015 0111   BILITOT 0.4 11/20/2013 1212   GFRNONAA >60 04/20/2015 0111   GFRNONAA >60 11/20/2013 1212   GFRAA >60 04/20/2015 0111   GFRAA >60 11/20/2013 1212       Component Value Date/Time   WBC 3.7* 04/20/2015 0111   WBC 4.5 04/19/2015 0934   WBC 5.7 11/20/2013 1212   HGB 12.6* 04/20/2015 0111   HGB 14.5 04/19/2015 0934   HGB 16.0 11/20/2013 1212   HCT 38.7* 04/20/2015 0111   HCT 43.1 04/19/2015 0934   HCT 48.7 11/20/2013 1212   MCV 91.7 04/20/2015 0111   MCV 92.3 04/19/2015 0934   MCV 91 11/20/2013 1212    Lipid Panel     Component Value Date/Time   CHOL 160 04/20/2015 0111   TRIG 233* 04/20/2015 0111   HDL 31* 04/20/2015 0111   CHOLHDL 5.2 04/20/2015 0111   VLDL 47* 04/20/2015 0111   LDLCALC 82 04/20/2015 0111    ABG No results found for: PHART, PCO2ART, PO2ART, HCO3, TCO2, ACIDBASEDEF, O2SAT   Lab Results  Component Value Date   TSH 1.668 04/19/2015   BNP (last 3 results)  Recent Labs  04/19/15 0934  BNP 150.4*    ProBNP (last 3 results) No results for input(s): PROBNP in the last 8760 hours.  Cardiac Panel (last 3 results) No results for input(s): CKTOTAL, CKMB, TROPONINI, RELINDX in the last 72 hours.  Iron/TIBC/Ferritin/ %Sat No results found for: IRON, TIBC, FERRITIN, IRONPCTSAT   EKG Orders placed or performed during the hospital encounter of 04/19/15  . EKG 12-Lead  . EKG 12-Lead  . ED EKG within 10 minutes  . ED EKG within 10 minutes  . EKG 12-Lead  . EKG 12-Lead  . EKG 12-Lead  . EKG 12-Lead  . EKG 12-Lead  . EKG 12-Lead  . EKG     Prior Assessment and Plan Problem List as of 04/29/2015      Cardiovascular and Mediastinum   SVT (supraventricular tachycardia)   Last Assessment & Plan 12/16/2013 Office  Visit Written 12/16/2013 11:04 AM by Vesta Mixer, MD    He's not had any  further episodes of supraventricular tachycardia. She's on low-dose diltiazem. We will increase his diltiazem to help his blood pressure.      HTN (hypertension)   Last Assessment & Plan 03/17/2014 Office Visit Written 03/17/2014 11:19 AM by Vesta Mixer, MD    His blood pressure is much better controlled today and in fact he has had symptoms consistent with orthostatic hypotension. I think that this is a combination of his HCTZ plus the fact that he is improved his diet and he's decreased his smoking. In addition, he's been walking 2 miles every day.  At this point we'll stop the HCTZ and potassium.  I've encouraged him to continue a healthy lifestyle. He will continue to monitor but his blood pressure and will call his back and his blood pressure readings increase. I'll see him in 6 months for followup visit.      Unstable angina pectoris   Unstable angina     Respiratory   Obstructive sleep apnea   Last Assessment & Plan 12/16/2013 Office Visit Written 12/16/2013 11:04 AM by Vesta Mixer, MD    He has a history of sleep apnea. He needs to get this evaluated further. He does not use a CPAP mask.        Other   Alcohol abuse   Last Assessment & Plan 12/16/2013 Office Visit Written 12/16/2013 11:28 AM by Vesta Mixer, MD    He is not drinking as much.  I've told him that excessive ETOH may worsen his sleep apnea.  He will discuss this further with his medical doctor.        Cigarette smoker   Last Assessment & Plan 12/01/2013 Office Visit Written 12/01/2013  9:02 AM by Vesta Mixer, MD    i have strongly encouraged him to stop smoking           Imaging: Dg Chest 2 View  04/19/2015   CLINICAL DATA:  Chest pain, shortness of breath  EXAM: CHEST  2 VIEW  COMPARISON:  November 20, 2013  FINDINGS: The heart size and mediastinal contours are stable. Heart size is mildly enlarged. The aorta is tortuous. There  is mild diffuse increased pulmonary interstitial markings bilaterally. There is no focal pneumonia or pleural effusion. Degenerative joint changes of the spine are identified.  IMPRESSION: Diffuse increased pulmonary interstitial markings, this can be seen in bronchitis/viral infection.  No focal pneumonia.   Electronically Signed   By: Sherian Rein M.D.   On: 04/19/2015 09:54

## 2015-04-29 NOTE — Patient Instructions (Signed)
Your physician recommends that you schedule a follow-up appointment in: 3 months in [facility]   Your physician has requested that you have an echocardiogram. Echocardiography is a painless test that uses sound waves to create images of your heart. It provides your doctor with information about the size and shape of your heart and how well your heart's chambers and valves are working. This procedure takes approximately one hour. There are no restrictions for this procedure.(3 months)  Your physician recommends that you return for lab work in: 3 months for a fasting lipid  ** do not eat or drink for 12 hours prior to blood work**  Thanks for [employer]!!!

## 2015-07-20 ENCOUNTER — Telehealth: Payer: Self-pay | Admitting: Cardiovascular Disease

## 2015-07-20 NOTE — Telephone Encounter (Signed)
Attempted to schedule from recall list.  Patient wants to have scheduled echo first  will call back when ready to schedule aware nahser seeing patients in [place] not [place]

## 2015-07-26 ENCOUNTER — Ambulatory Visit (HOSPITAL_COMMUNITY)
Admission: RE | Admit: 2015-07-26 | Discharge: 2015-07-26 | Disposition: A | Discharge status: Home / Self Care | Payer: BLUE CROSS/BLUE SHIELD | Source: Ambulatory Visit | Attending: Adult Health | Admitting: Adult Health

## 2015-07-26 DIAGNOSIS — I10 Essential (primary) hypertension: Secondary | ICD-10-CM | POA: Insufficient documentation

## 2015-07-26 DIAGNOSIS — Z720 Tobacco use: Secondary | ICD-10-CM | POA: Insufficient documentation

## 2015-07-29 ENCOUNTER — Encounter: Payer: Self-pay | Admitting: Cardiology

## 2015-07-29 ENCOUNTER — Ambulatory Visit (INDEPENDENT_AMBULATORY_CARE_PROVIDER_SITE_OTHER): Payer: BLUE CROSS/BLUE SHIELD | Admitting: Cardiology

## 2015-07-29 VITALS — BP 130/74 | HR 66 | Ht 72.0 in | Wt 204.0 lb

## 2015-07-29 DIAGNOSIS — I10 Essential (primary) hypertension: Secondary | ICD-10-CM

## 2015-07-29 DIAGNOSIS — I2510 Atherosclerotic heart disease of native coronary artery without angina pectoris: Secondary | ICD-10-CM | POA: Diagnosis not present

## 2015-07-29 DIAGNOSIS — I471 Supraventricular tachycardia: Secondary | ICD-10-CM | POA: Diagnosis not present

## 2015-07-29 NOTE — Progress Notes (Signed)
Patient ID: Wyatt Santiago, male   DOB: [birth year], 54 y.o.   MRN: [id number]     Clinical Summary Wyatt Santiago is a 54 y.o.male last seen by NP Lyman Bishop, this is our first visit together. He is seen for the following medical problems.   1. CAD - admit 03/2015 with chest pain. Received DES to LCX - 07/2015 echo LVEF 50-55%, abnormal diastolic function  - muscle cramp like pain on left side, 1/10. Can come with upper body activities. Worst with position. Otherwise no chest pain.     2. PSVT - no recent palpitaitons  3. HTN - compliant with meds  4. EtOH abuse - reports has decreased  5. Tobacco abuse - down to 1 pack per day - using e-cigarette   6. Hyperlipidemia - complinat with statin   Past Medical History  Diagnosis Date  . Hypertension   . SVT (supraventricular tachycardia)   . Thrombocytopenia   . Sleep apnea     "they say I've got it but I don't wear mask" (04/19/2015)  . Seizures     "when I was a kid"  . Arthritis     "hands" (04/19/2015)  . Coronary artery disease     a. cath 04/19/15 1.  Mid LAD to Dist LAD lesion, 40% stenosed, S/p PCI and DES to prox LCx with LV EF of 45%.     No Known Allergies   Current Outpatient Prescriptions  Medication Sig Dispense Refill  . aspirin 81 MG chewable tablet Chew 1 tablet (81 mg total) by mouth daily.    Marland Kitchen atorvastatin (LIPITOR) 40 MG tablet Take 1 tablet (40 mg total) by mouth daily at 6 PM. 30 tablet 6  . lisinopril (PRINIVIL,ZESTRIL) 20 MG tablet TAKE 1 TABLET (20 MG TOTAL) BY MOUTH DAILY. 90 tablet 3  . metoprolol tartrate (LOPRESSOR) 25 MG tablet Take 1 tablet (25 mg total) by mouth 2 (two) times daily. 60 tablet 6  . nitroGLYCERIN (NITROSTAT) 0.4 MG SL tablet Place 1 tablet (0.4 mg total) under the tongue every 5 (five) minutes x 3 doses as needed for chest pain. 25 tablet 12  . ticagrelor (BRILINTA) 90 MG TABS tablet Take 1 tablet (90 mg total) by mouth 2 (two) times daily. 60 tablet 11   No current  facility-administered medications for this visit.     Past Surgical History  Procedure Laterality Date  . Coronary angioplasty with stent placement  04/19/2015    "1 stent"  . Eye surgery Right     "lasered for macular degeneration"  . Cardiac catheterization N/A 04/19/2015    Procedure: Left Heart Cath and Coronary Angiography;  Surgeon: Lyn Records, MD;  Location: [facility] INVASIVE CV LAB;  Service: Cardiovascular;  Laterality: N/A;  . Cardiac catheterization N/A 04/19/2015    Procedure: Coronary Stent Intervention;  Surgeon: Lyn Records, MD;  Location: [facility] INVASIVE CV LAB;  Service: Cardiovascular;  Laterality: N/A;     No Known Allergies    Family History  Problem Relation Age of Onset  . Hypertension Father      Social History Wyatt Santiago reports that he has been smoking Cigarettes.  He started smoking about 43 years ago. He has a 40 pack-year smoking history. He has never used smokeless tobacco. Wyatt Santiago reports that he drinks about 37.8 oz of alcohol per week.   Review of Systems CONSTITUTIONAL: No weight loss, fever, chills, weakness or fatigue.  HEENT: Eyes: No visual loss, blurred vision, double vision or yellow  sclerae.No hearing loss, sneezing, congestion, runny nose or sore throat.  SKIN: No rash or itching.  CARDIOVASCULAR: per hpi RESPIRATORY: No shortness of breath, cough or sputum.  GASTROINTESTINAL: No anorexia, nausea, vomiting or diarrhea. No abdominal pain or blood.  GENITOURINARY: No burning on urination, no polyuria NEUROLOGICAL: No headache, dizziness, syncope, paralysis, ataxia, numbness or tingling in the extremities. No change in bowel or bladder control.  MUSCULOSKELETAL: No muscle, back pain, joint pain or stiffness.  LYMPHATICS: No enlarged nodes. No history of splenectomy.  PSYCHIATRIC: No history of depression or anxiety.  ENDOCRINOLOGIC: No reports of sweating, cold or heat intolerance. No polyuria or polydipsia.  Marland Kitchen.   Physical  Examination Filed Vitals:   07/29/15 0913  BP: 130/74  Pulse: 66   Filed Vitals:   07/29/15 0913  Height: 6' (1.829 m)  Weight: 204 lb (92.534 kg)    Gen: resting comfortably, no acute distress HEENT: no scleral icterus, pupils equal round and reactive, no palptable cervical adenopathy,  CV: RRR, no m/r/g, no jvd Resp: Clear to auscultation bilaterally GI: abdomen is soft, non-tender, non-distended, normal bowel sounds, no hepatosplenomegaly MSK: extremities are warm, no edema.  Skin: warm, no rash Neuro:  no focal deficits Psych: appropriate affect   Diagnostic Studies 07/2015 echo Study Conclusions  - Left ventricle: The cavity size was at the upper limits of normal. Wall thickness was normal. Systolic function was low normal. The estimated ejection fraction was in the range of 50% to 55%. Mild global hypokinesis. Diastolic dysfunction, indeterminate grade. - Aortic valve: Mildly calcified annulus. - Right atrium: The atrium was mildly dilated.  03/2015 cath 1. Mid LAD to Dist LAD lesion, 40% stenosed. 2. Mid RCA to Dist RCA lesion, 20% stenosed. 3. Ost 1st Mrg lesion, 40% stenosed. 4. Mid Cx-2 lesion, 50% stenosed. 5. Prox Cx lesion, 90% stenosed. There is a 0% residual stenosis post intervention. 6. A drug-eluting stent was placed.   Unstable angina pectoris due to high-grade obstruction in the proximal circumflex with in a tortuous segment.  Successful PCI with Synergy drug-eluting stent implantation and reduction in 90% stenosis to 0% with TIMI grade 3 flow.  Mild residual first obtuse marginal and mid circumflex obstruction less than 50%. Less than 50% obstruction in the mid LAD.  Overall mildly reduced LV function with mid anterior wall hypokinesis and estimated ejection fraction 45%.    Post Cath RECOMMENDATIONS:   Dual antiplatelet therapy (should probably switch to clopidogrel and aspirin after one month). For the time being and should  remain on Brilinta and aspirin.  Potentially able to discontinue gap therapy after 6 months using Synergy.  Potentially eligible for discharge in a.m. assuming no complications.  Ethanol abuse needs to be addressed with the patient and consideration of a plan for cessation instituted.    Assessment and Plan  1. CAD - no recent symptoms, continue current meds  2. PSVT - no current symptoms, continue beta blocker  3. HTN - at goal, continue current meds  4. Hyperlipidemia - continue high dose statin in setting of CAD   F/u 6 months   Antoine PocheJonathan F. Kanyon Seibold, M.D.

## 2015-07-29 NOTE — Patient Instructions (Signed)

## 2015-10-21 ENCOUNTER — Other Ambulatory Visit: Payer: Self-pay | Admitting: *Deleted

## 2015-10-21 MED ORDER — METOPROLOL TARTRATE 25 MG PO TABS: 25.0000 mg | ORAL_TABLET | Freq: Two times a day (BID) | ORAL | Status: DC

## 2015-10-21 MED ORDER — ATORVASTATIN CALCIUM 40 MG PO TABS: 40.0000 mg | ORAL_TABLET | Freq: Every day | ORAL | Status: DC

## 2015-11-14 ENCOUNTER — Other Ambulatory Visit: Payer: Self-pay | Admitting: Cardiovascular Disease

## 2016-04-24 ENCOUNTER — Other Ambulatory Visit: Payer: Self-pay | Admitting: Physician Assistant

## 2016-05-09 ENCOUNTER — Other Ambulatory Visit: Payer: Self-pay | Admitting: Cardiology

## 2016-05-19 ENCOUNTER — Encounter: Payer: Self-pay | Admitting: *Deleted

## 2016-05-22 ENCOUNTER — Ambulatory Visit (INDEPENDENT_AMBULATORY_CARE_PROVIDER_SITE_OTHER): Payer: BLUE CROSS/BLUE SHIELD | Admitting: Cardiology

## 2016-05-22 ENCOUNTER — Encounter: Payer: Self-pay | Admitting: *Deleted

## 2016-05-22 ENCOUNTER — Encounter: Payer: Self-pay | Admitting: Cardiology

## 2016-05-22 VITALS — BP 138/81 | HR 64 | Ht 72.0 in | Wt 209.0 lb

## 2016-05-22 DIAGNOSIS — I471 Supraventricular tachycardia: Secondary | ICD-10-CM | POA: Diagnosis not present

## 2016-05-22 DIAGNOSIS — E782 Mixed hyperlipidemia: Secondary | ICD-10-CM

## 2016-05-22 DIAGNOSIS — I2510 Atherosclerotic heart disease of native coronary artery without angina pectoris: Secondary | ICD-10-CM

## 2016-05-22 DIAGNOSIS — I10 Essential (primary) hypertension: Secondary | ICD-10-CM

## 2016-05-22 NOTE — Progress Notes (Signed)
Clinical Summary Wyatt Santiago is a 55 y.o.male seen today for follow up of the following medical problems.   1. CAD - admit 03/2015 with chest pain. Received DES to LCX - 07/2015 echo LVEF 50-55%, abnormal diastolic function   - no recent chest pain. No SOB or DOE - compliant with meds. He has completed one year of DAPT.    2. PSVT - no recent palpitaitons since last visit  3. HTN - compliant with meds  4. Hyperlipidemia - complinat with statin Past Medical History:  Diagnosis Date  . Arthritis    "hands" (04/19/2015)  . Coronary artery disease    a. cath 04/19/15 1.  Mid LAD to Dist LAD lesion, 40% stenosed, S/p PCI and DES to prox LCx with LV EF of 45%.  . Hypertension   . Seizures (HCC)    "when I was a kid"  . Sleep apnea    "they say I've got it but I don't wear mask" (04/19/2015)  . SVT (supraventricular tachycardia) (HCC)   . Thrombocytopenia (HCC)      No Known Allergies   Current Outpatient Prescriptions  Medication Sig Dispense Refill  . aspirin 81 MG chewable tablet Chew 1 tablet (81 mg total) by mouth daily.    Marland Kitchen. atorvastatin (LIPITOR) 40 MG tablet TAKE 1 TABLET (40 MG TOTAL) BY MOUTH DAILY AT 6 PM. 90 tablet 1  . BRILINTA 90 MG TABS tablet TAKE 1 TABLET BY MOUTH TWICE A DAY 60 tablet 3  . lisinopril (PRINIVIL,ZESTRIL) 20 MG tablet TAKE 1 TABLET (20 MG TOTAL) BY MOUTH DAILY. 90 tablet 3  . lisinopril (PRINIVIL,ZESTRIL) 20 MG tablet TAKE 1 TABLET (20 MG TOTAL) BY MOUTH DAILY. 90 tablet 2  . metoprolol tartrate (LOPRESSOR) 25 MG tablet Take 1 tablet (25 mg total) by mouth 2 (two) times daily. 180 tablet 2  . nitroGLYCERIN (NITROSTAT) 0.4 MG SL tablet Place 1 tablet (0.4 mg total) under the tongue every 5 (five) minutes x 3 doses as needed for chest pain. 25 tablet 12   No current facility-administered medications for this visit.      Past Surgical History:  Procedure Laterality Date  . CARDIAC CATHETERIZATION N/A 04/19/2015   Procedure: Left  Heart Cath and Coronary Angiography;  Surgeon: Lyn RecordsHenry W Smith, MD;  [facility];  Service: Cardiovascular;  Laterality: N/A;  . CARDIAC CATHETERIZATION N/A 04/19/2015   Procedure: Coronary Stent Intervention;  Surgeon: Lyn RecordsHenry W Smith, MD;  [facility];  Service: Cardiovascular;  Laterality: N/A;  . CORONARY ANGIOPLASTY WITH STENT PLACEMENT  04/19/2015   "1 stent"  . EYE SURGERY Right    "lasered for macular degeneration"     No Known Allergies    Family History  Problem Relation Age of Onset  . Hypertension Father      Social History Wyatt Santiago reports that he has been smoking Cigarettes.  He started smoking about 44 years ago. He has a 40.00 pack-year smoking history. He has never used smokeless tobacco. Wyatt Santiago reports that he drinks about 37.8 oz of alcohol per week .   Review of Systems CONSTITUTIONAL: No weight loss, fever, chills, weakness or fatigue.  HEENT: Eyes: No visual loss, blurred vision, double vision or yellow sclerae.No hearing loss, sneezing, congestion, runny nose or sore throat.  SKIN: No rash or itching.  CARDIOVASCULAR: per HPI RESPIRATORY: No shortness of breath, cough or sputum.  GASTROINTESTINAL: No anorexia, nausea, vomiting or diarrhea. No abdominal pain  or blood.  GENITOURINARY: No burning on urination, no polyuria NEUROLOGICAL: No headache, dizziness, syncope, paralysis, ataxia, numbness or tingling in the extremities. No change in bowel or bladder control.  MUSCULOSKELETAL: No muscle, back pain, joint pain or stiffness.  LYMPHATICS: No enlarged nodes. No history of splenectomy.  PSYCHIATRIC: No history of depression or anxiety.  ENDOCRINOLOGIC: No reports of sweating, cold or heat intolerance. No polyuria or polydipsia.  Marland Kitchen   Physical Examination Vitals:   05/22/16 0910  BP: 138/81  Pulse: 64   Vitals:   05/22/16 0910  Weight: 209 lb (94.8 kg)  Height: 6' (1.829 m)    Gen: resting comfortably, no acute  distress HEENT: no scleral icterus, pupils equal round and reactive, no palptable cervical adenopathy,  CV: RRR, no m/r/g, no jvd Resp: Clear to auscultation bilaterally GI: abdomen is soft, non-tender, non-distended, normal bowel sounds, no hepatosplenomegaly MSK: extremities are warm, no edema.  Skin: warm, no rash Neuro:  no focal deficits Psych: appropriate affect   Diagnostic Studies 07/2015 echo Study Conclusions  - Left ventricle: The cavity size was at the upper limits of normal. Wall thickness was normal. Systolic function was low normal. The estimated ejection fraction was in the range of 50% to 55%. Mild global hypokinesis. Diastolic dysfunction, indeterminate grade. - Aortic valve: Mildly calcified annulus. - Right atrium: The atrium was mildly dilated.  03/2015 cath 1. Mid LAD to Dist LAD lesion, 40% stenosed. 2. Mid RCA to Dist RCA lesion, 20% stenosed. 3. Ost 1st Mrg lesion, 40% stenosed. 4. Mid Cx-2 lesion, 50% stenosed. 5. Prox Cx lesion, 90% stenosed. There is a 0% residual stenosis post intervention. 6. A drug-eluting stent was placed.   Unstable angina pectoris due to high-grade obstruction in the proximal circumflex with in a tortuous segment.  Successful PCI with Synergy drug-eluting stent implantation and reduction in 90% stenosis to 0% with TIMI grade 3 flow.  Mild residual first obtuse marginal and mid circumflex obstruction less than 50%. Less than 50% obstruction in the mid LAD.  Overall mildly reduced LV function with mid anterior wall hypokinesis and estimated ejection fraction 45%.    Post Cath RECOMMENDATIONS:   Dual antiplatelet therapy (should probably switch to clopidogrel and aspirin after one month). For the time being and should remain on Brilinta and aspirin.  Potentially able to discontinue gap therapy after 6 months using Synergy.  Potentially eligible for discharge in a.m. assuming no complications.  Ethanol  abuse needs to be addressed with the patient and consideration of a plan for cessation instituted.    Assessment and Plan   1. CAD - no recent symptoms - he will stop brillinta as it is over a year since his stent - EKG in clinic SR, RBBB, occasoinal PVCs. No acute ischemic changes  2. PSVT - no current symptoms - continue to monitor  3. HTN - at goal, he will continue current meds  4. Hyperlipidemia - continue high dose statin in setting of CAD - repeat lipid panel   F/u 6 months. Order annual labs.      Antoine Poche, M.D.

## 2016-05-22 NOTE — Patient Instructions (Signed)
Your physician wants you to follow-up in: 6 MONTHS WITH DR. BRANCH  You will receive a reminder letter in the mail two months in advance. If you don't receive a letter, please call our office to schedule the follow-up appointment.  Your physician has recommended you make the following change in your medication:   STOP BRILINTA   Your physician recommends that you return for lab work PLEASE FAST FOR LAB WORK CBC/HGBA1C/TSH/BMP/MG/LIPIDS  Thank you for choosing [facility]!!

## 2016-06-06 ENCOUNTER — Telehealth: Payer: Self-pay | Admitting: *Deleted

## 2016-06-06 NOTE — Telephone Encounter (Signed)
-----   Message from Antoine PocheJonathan F Branch, MD sent at 06/05/2016  1:01 PM EDT ----- Blood work looks good  Dominga FerryJ Branch MD

## 2016-06-06 NOTE — Telephone Encounter (Signed)
Pt aware - routed to pcp  

## 2016-08-20 ENCOUNTER — Other Ambulatory Visit: Payer: Self-pay | Admitting: Cardiology

## 2016-11-06 ENCOUNTER — Other Ambulatory Visit: Payer: Self-pay | Admitting: Cardiology

## 2016-12-06 ENCOUNTER — Other Ambulatory Visit: Payer: Self-pay | Admitting: Cardiology

## 2016-12-27 ENCOUNTER — Other Ambulatory Visit: Payer: Self-pay | Admitting: Cardiology

## 2016-12-30 ENCOUNTER — Other Ambulatory Visit: Payer: Self-pay | Admitting: Cardiovascular Disease

## 2017-01-27 ENCOUNTER — Other Ambulatory Visit: Payer: Self-pay | Admitting: Cardiology

## 2017-04-11 ENCOUNTER — Other Ambulatory Visit: Payer: Self-pay | Admitting: Cardiology

## 2017-05-08 ENCOUNTER — Other Ambulatory Visit: Payer: Self-pay | Admitting: Cardiology

## 2017-05-23 ENCOUNTER — Ambulatory Visit (INDEPENDENT_AMBULATORY_CARE_PROVIDER_SITE_OTHER): Payer: Self-pay | Admitting: Cardiology

## 2017-05-23 ENCOUNTER — Encounter: Payer: Self-pay | Admitting: Cardiology

## 2017-05-23 VITALS — BP 143/80 | HR 60 | Ht 72.0 in | Wt 206.0 lb

## 2017-05-23 DIAGNOSIS — I10 Essential (primary) hypertension: Secondary | ICD-10-CM

## 2017-05-23 DIAGNOSIS — R7309 Other abnormal glucose: Secondary | ICD-10-CM

## 2017-05-23 DIAGNOSIS — R5383 Other fatigue: Secondary | ICD-10-CM

## 2017-05-23 DIAGNOSIS — E785 Hyperlipidemia, unspecified: Secondary | ICD-10-CM

## 2017-05-23 DIAGNOSIS — I471 Supraventricular tachycardia: Secondary | ICD-10-CM

## 2017-05-23 DIAGNOSIS — I2510 Atherosclerotic heart disease of native coronary artery without angina pectoris: Secondary | ICD-10-CM

## 2017-05-23 NOTE — Patient Instructions (Addendum)
Medication Instructions:  Continue all current medications.  Labwork:  CMET, CBC, FLP, TSH, HgbA1c, Magnesium - orders given today.  Due in 2 weeks.  Reminder:  Nothing to eat or drink after 12 midnight prior to labs.  Office will contact with results via phone or letter.    Testing/Procedures: none  Follow-Up: Your physician wants you to follow up in:  1 year.  You will receive a reminder letter in the mail one-two months in advance.  If you don't receive a letter, please call our office to schedule the follow up appointment   Any Other Special Instructions Will Be Listed Below (If Applicable). Your physician has requested that you regularly monitor and record your blood pressure readings at home. Please take readings approximately 2 hours after medication.  Log x 1 week & return to office for MD review.    If you need a refill on your cardiac medications before your next appointment, please call your pharmacy.

## 2017-05-23 NOTE — Progress Notes (Addendum)
Clinical Summary Mr. Tay is a 56 y.o.male seen today for follow up of the following medical problems.   1. CAD - admit 03/2015 with chest pain. Received DES to LCX - 07/2015 echo LVEF 50-55%, abnormal diastolic function    - sharp pain entire chest, 4/10 in severity. Mainly occurs after eating. +SOB. Can feel warm all over. Symptoms last few minutes, better with tums - compliant with meds  2. PSVT - he denies any recent palpitations.   3. HTN - compliant with meds - has been taking ibuprofen for back pain recently  4. Hyperlipidemia - he is compliant with statin   Past Medical History:  Diagnosis Date  . Arthritis    "hands" (04/19/2015)  . Coronary artery disease    a. cath 04/19/15 1.  Mid LAD to Dist LAD lesion, 40% stenosed, S/p PCI and DES to prox LCx with LV EF of 45%.  . Hypertension   . Seizures (HCC)    "when I was a kid"  . Sleep apnea    "they say I've got it but I don't wear mask" (04/19/2015)  . SVT (supraventricular tachycardia) (HCC)   . Thrombocytopenia (HCC)      No Known Allergies   Current Outpatient Prescriptions  Medication Sig Dispense Refill  . aspirin 81 MG chewable tablet Chew 1 tablet (81 mg total) by mouth daily.    Marland Kitchen atorvastatin (LIPITOR) 40 MG tablet TAKE 1 TABLET BY MOUTH EVERY DAY AT 6PM 15 tablet 0  . atorvastatin (LIPITOR) 40 MG tablet TAKE 1 TABLET BY MOUTH EVERY DAY AT 6PM 15 tablet 0  . lisinopril (PRINIVIL,ZESTRIL) 20 MG tablet TAKE 1 TABLET (20 MG TOTAL) BY MOUTH DAILY. 90 tablet 3  . lisinopril (PRINIVIL,ZESTRIL) 20 MG tablet TAKE 1 TABLET BY MOUTH EVERY DAY 30 tablet 0  . metoprolol tartrate (LOPRESSOR) 25 MG tablet TAKE 1 TABLET (25 MG TOTAL) BY MOUTH 2 (TWO) TIMES DAILY. 180 tablet 2  . nitroGLYCERIN (NITROSTAT) 0.4 MG SL tablet Place 1 tablet (0.4 mg total) under the tongue every 5 (five) minutes x 3 doses as needed for chest pain. 25 tablet 12   No current facility-administered medications for this visit.       Past Surgical History:  Procedure Laterality Date  . CARDIAC CATHETERIZATION N/A 04/19/2015   Procedure: Left Heart Cath and Coronary Angiography;  Surgeon: Lyn Records, MD;  Location: [facility] INVASIVE CV LAB;  Service: Cardiovascular;  Laterality: N/A;  . CARDIAC CATHETERIZATION N/A 04/19/2015   Procedure: Coronary Stent Intervention;  Surgeon: Lyn Records, MD;  Location: [facility] INVASIVE CV LAB;  Service: Cardiovascular;  Laterality: N/A;  . CORONARY ANGIOPLASTY WITH STENT PLACEMENT  04/19/2015   "1 stent"  . EYE SURGERY Right    "lasered for macular degeneration"     No Known Allergies    Family History  Problem Relation Age of Onset  . Hypertension Father      Social History Mr. Ground reports that he has been smoking Cigarettes.  He started smoking about 45 years ago. He has a 40.00 pack-year smoking history. He has never used smokeless tobacco. Mr. Reale reports that he drinks about 37.8 oz of alcohol per week .   Review of Systems CONSTITUTIONAL: No weight loss, fever, chills, weakness or fatigue.  HEENT: Eyes: No visual loss, blurred vision, double vision or yellow sclerae.No hearing loss, sneezing, congestion, runny nose or sore throat.  SKIN: No rash or itching.  CARDIOVASCULAR: per hpi RESPIRATORY: No  shortness of breath, cough or sputum.  GASTROINTESTINAL: No anorexia, nausea, vomiting or diarrhea. No abdominal pain or blood.  GENITOURINARY: No burning on urination, no polyuria NEUROLOGICAL: No headache, dizziness, syncope, paralysis, ataxia, numbness or tingling in the extremities. No change in bowel or bladder control.  MUSCULOSKELETAL: No muscle, back pain, joint pain or stiffness.  LYMPHATICS: No enlarged nodes. No history of splenectomy.  PSYCHIATRIC: No history of depression or anxiety.  ENDOCRINOLOGIC: No reports of sweating, cold or heat intolerance. No polyuria or polydipsia.  Marland Kitchen   Physical Examination Vitals:   05/23/17 1104  BP: (!) 143/80  Pulse:  60  SpO2: 98%   Vitals:   05/23/17 1104  Weight: 206 lb (93.4 kg)  Height: 6' (1.829 m)    Gen: resting comfortably, no acute distress HEENT: no scleral icterus, pupils equal round and reactive, no palptable cervical adenopathy,  CV: RRR, no m/r/g, no jvd Resp: Clear to auscultation bilaterally GI: abdomen is soft, non-tender, non-distended, normal bowel sounds, no hepatosplenomegaly MSK: extremities are warm, no edema.  Skin: warm, no rash Neuro:  no focal deficits Psych: appropriate affect   Diagnostic Studies 07/2015 echo Study Conclusions  - Left ventricle: The cavity size was at the upper limits of normal. Wall thickness was normal. Systolic function was low normal. The estimated ejection fraction was in the range of 50% to 55%. Mild global hypokinesis. Diastolic dysfunction, indeterminate grade. - Aortic valve: Mildly calcified annulus. - Right atrium: The atrium was mildly dilated.  03/2015 cath 1. Mid LAD to Dist LAD lesion, 40% stenosed. 2. Mid RCA to Dist RCA lesion, 20% stenosed. 3. Ost 1st Mrg lesion, 40% stenosed. 4. Mid Cx-2 lesion, 50% stenosed. 5. Prox Cx lesion, 90% stenosed. There is a 0% residual stenosis post intervention. 6. A drug-eluting stent was placed.   Unstable angina pectoris due to high-grade obstruction in the proximal circumflex with in a tortuous segment.  Successful PCI with Synergy drug-eluting stent implantation and reduction in 90% stenosis to 0% with TIMI grade 3 flow.  Mild residual first obtuse marginal and mid circumflex obstruction less than 50%. Less than 50% obstruction in the mid LAD.  Overall mildly reduced LV function with mid anterior wall hypokinesis and estimated ejection fraction 45%.    Post Cath RECOMMENDATIONS:   Dual antiplatelet therapy (should probably switch to clopidogrel and aspirin after one month). For the time being and should remain on Brilinta and aspirin.  Potentially able to  discontinue gap therapy after 6 months using Synergy.  Potentially eligible for discharge in a.m. assuming no complications.  Ethanol abuse needs to be addressed with the patient and consideration of a plan for cessation instituted.     Assessment and Plan  1. CAD - recent atypical symptoms consistent with GERD, recommended OTC zantac - continue current cardiac meds  2. PSVT - no recent symptoms, continue beta blocker - EKGin clinic today shows normal sinus rhythm  3. HTN - elevated in clinic, previously well controlled. Ongoing back pain with heavy NSAID use. Counseled to wean NSAID use, monitor bp x 1 week and call with results.   4. Hyperlipidemia - continue high dose statin in setting of CAD - we will repeat lipid panel  F/u 1 year      Antoine Poche, M.D.

## 2017-05-24 ENCOUNTER — Other Ambulatory Visit: Payer: Self-pay | Admitting: Cardiology

## 2017-05-30 ENCOUNTER — Other Ambulatory Visit: Payer: Self-pay | Admitting: Cardiology

## 2017-06-20 ENCOUNTER — Other Ambulatory Visit: Payer: Self-pay | Admitting: Cardiology

## 2017-06-21 ENCOUNTER — Other Ambulatory Visit: Payer: Self-pay | Admitting: Cardiology

## 2017-06-22 ENCOUNTER — Other Ambulatory Visit: Payer: Self-pay | Admitting: Cardiology

## 2017-07-29 ENCOUNTER — Other Ambulatory Visit: Payer: Self-pay | Admitting: Cardiovascular Disease

## 2018-01-13 ENCOUNTER — Encounter (HOSPITAL_COMMUNITY): Payer: Self-pay | Admitting: Emergency Medicine

## 2018-01-13 ENCOUNTER — Emergency Department (HOSPITAL_COMMUNITY): Payer: PRIVATE HEALTH INSURANCE

## 2018-01-13 ENCOUNTER — Emergency Department (HOSPITAL_COMMUNITY)
Admission: EM | Admit: 2018-01-13 | Discharge: 2018-01-13 | Disposition: A | Discharge status: Home / Self Care | Payer: PRIVATE HEALTH INSURANCE | Attending: Emergency Medicine | Admitting: Emergency Medicine

## 2018-01-13 ENCOUNTER — Other Ambulatory Visit: Payer: Self-pay

## 2018-01-13 DIAGNOSIS — F10220 Alcohol dependence with intoxication, uncomplicated: Secondary | ICD-10-CM | POA: Diagnosis not present

## 2018-01-13 DIAGNOSIS — F10920 Alcohol use, unspecified with intoxication, uncomplicated: Secondary | ICD-10-CM

## 2018-01-13 DIAGNOSIS — R079 Chest pain, unspecified: Secondary | ICD-10-CM | POA: Insufficient documentation

## 2018-01-13 DIAGNOSIS — I119 Hypertensive heart disease without heart failure: Secondary | ICD-10-CM | POA: Diagnosis not present

## 2018-01-13 DIAGNOSIS — F17210 Nicotine dependence, cigarettes, uncomplicated: Secondary | ICD-10-CM | POA: Insufficient documentation

## 2018-01-13 DIAGNOSIS — I2510 Atherosclerotic heart disease of native coronary artery without angina pectoris: Secondary | ICD-10-CM | POA: Insufficient documentation

## 2018-01-13 LAB — BASIC METABOLIC PANEL
Anion gap: 9 (ref 5–15)
BUN: 13 mg/dL (ref 6–20)
CO2: 23 mmol/L (ref 22–32)
Calcium: 8.6 mg/dL — ABNORMAL LOW (ref 8.9–10.3)
Chloride: 106 mmol/L (ref 101–111)
Creatinine, Ser: 0.95 mg/dL (ref 0.61–1.24)
GFR calc Af Amer: >60 mL/min (ref >60)
GFR calc non Af Amer: >60 mL/min (ref >60)
Glucose, Bld: 103 mg/dL — ABNORMAL HIGH (ref 65–99)
Potassium: 3.7 mmol/L (ref 3.5–5.1)
Sodium: 138 mmol/L (ref 135–145)

## 2018-01-13 LAB — ETHANOL: Alcohol, Ethyl (B): 194 mg/dL — ABNORMAL HIGH (ref <10)

## 2018-01-13 LAB — URINALYSIS, ROUTINE W REFLEX MICROSCOPIC
Bacteria, UA: NONE SEEN
Bilirubin Urine: NEGATIVE
Glucose, UA: NEGATIVE mg/dL
Ketones, ur: NEGATIVE mg/dL
Leukocytes, UA: NEGATIVE
Nitrite: NEGATIVE
Protein, ur: NEGATIVE mg/dL
Specific Gravity, Urine: 1.004 — ABNORMAL LOW (ref 1.005–1.030)
pH: 5 (ref 5.0–8.0)

## 2018-01-13 LAB — CBC
HCT: 43.3 % (ref 39.0–52.0)
Hemoglobin: 13.9 g/dL (ref 13.0–17.0)
MCH: 29.2 pg (ref 26.0–34.0)
MCHC: 32.1 g/dL (ref 30.0–36.0)
MCV: 91 fL (ref 78.0–100.0)
Platelets: 112 10*3/uL — ABNORMAL LOW (ref 150–400)
RBC: 4.76 MIL/uL (ref 4.22–5.81)
RDW: 14.2 % (ref 11.5–15.5)
WBC: 7.6 10*3/uL (ref 4.0–10.5)

## 2018-01-13 LAB — TROPONIN I
Troponin I: <0.03 ng/mL (ref <0.03)
Troponin I: <0.03 ng/mL (ref <0.03)

## 2018-01-13 MED ORDER — NITROGLYCERIN 0.4 MG SL SUBL: 0.4000 mg | SUBLINGUAL_TABLET | SUBLINGUAL | 0 refills | Status: DC | PRN

## 2018-01-13 NOTE — ED Triage Notes (Signed)
Pt came in EMS because wife called tonight. Wife stated he was dizzy, pale and diaphoretic. Pt appears intoxicated. Pt denies chest pain today. Pt states he had chest pain yesterday. CBG 102 pt has cardiac history with stents.

## 2018-01-13 NOTE — Discharge Instructions (Addendum)
Use the nitroglycerin if you get the chest pain again. Please call Dr Verna Czech office on Tuesday, May 28 to get an appointment to be evaluated for your chest pain soon. Return to the ED if you get chest pain that is not relieved by the nitroglycerin.

## 2018-01-13 NOTE — ED Triage Notes (Signed)
Pt spouse pt reported chest pain aprox. An hour ago. The pain was intermittent, and the episode lasted 5 to 10 minutes with nausea

## 2018-01-13 NOTE — ED Provider Notes (Signed)
[facility] EMERGENCY DEPARTMENT Provider Note   CSN: [id number] Arrival date & time: 01/13/18  0326  Time seen 5:47 AM   History   Chief Complaint Chief Complaint  Patient presents with  . Chest Pain    Level 5 caveat for alcohol intoxication  HPI Wyatt Santiago is a 57 y.o. male.  HPI patient is here with a male and they state they had a party tonight.  He normally drinks 6 beers daily however tonight he drank 12 beers +6 ounces of liquor.  I am having difficulty getting history from the patient, he is unable to concentrate or follow one line of thought.  He states something about he started having chest pain 3 weeks ago and it has been there constantly.  He also complains of having low energy and shortness of breath.  He initially told me he is having "constant lung pain" that is dull and in the front and back, but then his male friend states about 2:30 AM he was having chest pain that he cannot describe to me.  Patient denies having chest pain now.  Patient becomes irate when I tell him he appears to be very intoxicated.  PCP Patient, No Pcp Per Cardiology Dr Wyline Mood  Past Medical History:  Diagnosis Date  . Arthritis    "hands" (04/19/2015)  . Coronary artery disease    a. cath 04/19/15 1.  Mid LAD to Dist LAD lesion, 40% stenosed, S/p PCI and DES to prox LCx with LV EF of 45%.  . Hypertension   . Seizures (HCC)    "when I was a kid"  . Sleep apnea    "they say I've got it but I don't wear mask" (04/19/2015)  . SVT (supraventricular tachycardia) (HCC)   . Thrombocytopenia [facility])     Patient Active Problem List   Diagnosis Date Noted  . Unstable angina pectoris (HCC) 04/19/2015  . Unstable angina (HCC) 04/19/2015  . SVT (supraventricular tachycardia) (HCC) 12/01/2013  . HTN (hypertension) 12/01/2013  . Obstructive sleep apnea 12/01/2013  . Alcohol abuse 12/01/2013  . Cigarette smoker 12/01/2013    Past Surgical History:  Procedure Laterality Date  . CARDIAC  CATHETERIZATION N/A 04/19/2015   Procedure: Left Heart Cath and Coronary Angiography;  Surgeon: Lyn Records, MD;  Location: [facility] INVASIVE CV LAB;  Service: Cardiovascular;  Laterality: N/A;  . CARDIAC CATHETERIZATION N/A 04/19/2015   Procedure: Coronary Stent Intervention;  Surgeon: Lyn Records, MD;  Location: [facility] INVASIVE CV LAB;  Service: Cardiovascular;  Laterality: N/A;  . CORONARY ANGIOPLASTY WITH STENT PLACEMENT  04/19/2015   "1 stent"  . EYE SURGERY Right    "lasered for macular degeneration"        Home Medications    Prior to Admission medications   Medication Sig Start Date End Date Taking? Authorizing Provider  aspirin 81 MG chewable tablet Chew 1 tablet (81 mg total) by mouth daily. 04/20/15   Bhagat, Sharrell Ku, PA  atorvastatin (LIPITOR) 40 MG tablet TAKE 1 TABLET BY MOUTH EVERY DAY AT 6PM 06/20/17   Antoine Poche, MD  atorvastatin (LIPITOR) 40 MG tablet TAKE 1 TABLET BY MOUTH EVERY DAY AT 6PM 07/30/17   Antoine Poche, MD  lisinopril (PRINIVIL,ZESTRIL) 20 MG tablet TAKE 1 TABLET BY MOUTH EVERY DAY 06/22/17   Jonelle Sidle, MD  metoprolol tartrate (LOPRESSOR) 25 MG tablet TAKE 1 TABLET (25 MG TOTAL) BY MOUTH 2 (TWO) TIMES DAILY. 06/21/17   Antoine Poche, MD  nitroGLYCERIN (NITROSTAT)  0.4 MG SL tablet Place 1 tablet (0.4 mg total) under the tongue every 5 (five) minutes as needed for chest pain. 01/13/18   Devoria Albe, MD    Family History Family History  Problem Relation Age of Onset  . Hypertension Father     Social History Social History   Tobacco Use  . Smoking status: Current Every Day Smoker    Packs/day: 1.00    Years: 40.00    Pack years: 40.00    Types: Cigarettes    Start date: 04/28/1972  . Smokeless tobacco: Never Used  Substance Use Topics  . Alcohol use: Yes    Alcohol/week: 37.8 oz    Types: 63 Cans of beer per week    Comment: 04/19/2015 Drinks about a 8-9 beers/day.  . Drug use: No     Allergies   Patient has no known  allergies.   Review of Systems Review of Systems  All other systems reviewed and are negative.    Physical Exam Updated Vital Signs BP 103/68   Pulse 72   Temp (!) 97.3 F (36.3 C) (Oral)   Resp 14   Ht 6' (1.829 m)   Wt 93 kg (205 lb)   SpO2 95%   BMI 27.80 kg/m   Vital signs normal    Physical Exam  Constitutional: He is oriented to person, place, and time. He appears well-developed and well-nourished.  Non-toxic appearance. He does not appear ill. No distress.  HENT:  Head: Normocephalic and atraumatic.  Right Ear: External ear normal.  Left Ear: External ear normal.  Nose: Nose normal. No mucosal edema or rhinorrhea.  Mouth/Throat: Oropharynx is clear and moist and mucous membranes are normal. No dental abscesses or uvula swelling.  Eyes: Pupils are equal, round, and reactive to light. Conjunctivae and EOM are normal.  Neck: Normal range of motion and full passive range of motion without pain. Neck supple.  Cardiovascular: Normal rate, regular rhythm and normal heart sounds. Exam reveals no gallop and no friction rub.  No murmur heard. Pulmonary/Chest: Effort normal and breath sounds normal. No respiratory distress. He has no wheezes. He has no rhonchi. He has no rales. He exhibits no tenderness and no crepitus.  Abdominal: Soft. Normal appearance and bowel sounds are normal. He exhibits no distension. There is no tenderness. There is no rebound and no guarding.  Musculoskeletal: Normal range of motion. He exhibits no edema or tenderness.  Moves all extremities well.   Neurological: He is alert and oriented to person, place, and time. He has normal strength. No cranial nerve deficit.  Skin: Skin is warm, dry and intact. No rash noted. No erythema. No pallor.  Face is flushed  Psychiatric: His affect is labile. His speech is delayed and slurred. He is slowed.  Patient speech is very slurred, he has difficulty speaking in sentences.  Nursing note and vitals  reviewed.    ED Treatments / Results  Labs (all labs ordered are listed, but only abnormal results are displayed)  Results for orders placed or performed during the hospital encounter of 01/13/18  Basic metabolic panel  Result Value Ref Range   Sodium 138 135 - 145 mmol/L   Potassium 3.7 3.5 - 5.1 mmol/L   Chloride 106 101 - 111 mmol/L   CO2 23 22 - 32 mmol/L   Glucose, Bld 103 (H) 65 - 99 mg/dL   BUN 13 6 - 20 mg/dL   Creatinine, Ser 1.61 0.61 - 1.24 mg/dL   Calcium 8.6 (  L) 8.9 - 10.3 mg/dL   GFR calc non Af Amer >60 >60 mL/min   GFR calc Af Amer >60 >60 mL/min   Anion gap 9 5 - 15  CBC  Result Value Ref Range   WBC 7.6 4.0 - 10.5 K/uL   RBC 4.76 4.22 - 5.81 MIL/uL   Hemoglobin 13.9 13.0 - 17.0 g/dL   HCT 16.1 09.6 - 04.5 %   MCV 91.0 78.0 - 100.0 fL   MCH 29.2 26.0 - 34.0 pg   MCHC 32.1 30.0 - 36.0 g/dL   RDW 40.9 81.1 - 91.4 %   Platelets 112 (L) 150 - 400 K/uL  Troponin I  Result Value Ref Range   Troponin I <0.03 <0.03 ng/mL  Urinalysis, Routine w reflex microscopic  Result Value Ref Range   Color, Urine STRAW (A) YELLOW   APPearance CLEAR CLEAR   Specific Gravity, Urine 1.004 (L) 1.005 - 1.030   pH 5.0 5.0 - 8.0   Glucose, UA NEGATIVE NEGATIVE mg/dL   Hgb urine dipstick SMALL (A) NEGATIVE   Bilirubin Urine NEGATIVE NEGATIVE   Ketones, ur NEGATIVE NEGATIVE mg/dL   Protein, ur NEGATIVE NEGATIVE mg/dL   Nitrite NEGATIVE NEGATIVE   Leukocytes, UA NEGATIVE NEGATIVE   WBC, UA 0-5 0 - 5 WBC/hpf   Bacteria, UA NONE SEEN NONE SEEN  Troponin I  Result Value Ref Range   Troponin I <0.03 <0.03 ng/mL  Ethanol  Result Value Ref Range   Alcohol, Ethyl (B) 194 (H) <10 mg/dL   Laboratory interpretation all normal except alcohol intoxication   EKG EKG Interpretation  Date/Time:  Sunday Jan 13 2018 03:36:00 EDT Ventricular Rate:  82 PR Interval:    QRS Duration: 107 QT Interval:  382 QTC Calculation: 447 R Axis:   -19 Text Interpretation:  Sinus rhythm  Borderline left axis deviation Minimal ST elevation, inferior leads Since last tracing rate faster 10 Apr 2015 Confirmed by Devoria Albe ([id number]) on 01/13/2018 4:44:28 AM   Radiology Dg Chest 2 View  Result Date: 01/13/2018 CLINICAL DATA:  Chest pain in our ago.  Nausea. EXAM: CHEST - 2 VIEW COMPARISON:  04/19/2015 FINDINGS: Normal heart size and pulmonary vascularity. Mild central interstitial pattern to the lungs may reflect chronic bronchitis. No airspace disease or consolidation. No blunting of costophrenic angles. No pneumothorax. Mediastinal contours appear intact. Calcification of the aorta. Degenerative changes in the spine. IMPRESSION: Chronic bronchitic changes in the lungs. Aortic atherosclerosis. No evidence of active pulmonary disease. Electronically Signed   By: Burman Nieves M.D.   On: 01/13/2018 05:04    Procedures Procedures (including critical care time)  Medications Ordered in ED Medications - No data to display   Initial Impression / Assessment and Plan / ED Course  I have reviewed the triage vital signs and the nursing notes.  Pertinent labs & imaging results that were available during my care of the patient were reviewed by me and considered in my medical decision making (see chart for details).     Patient either has had chest pain constantly for the past 3 weeks or is not having pain now.  Since the last thing he told me was he was not having pain he was not given medication.  Delta troponin was done including an alcohol level.  Patient will probably need to stay into the ED until he sobers up and can give a more concise history.  8:30 AM patient is now sleeping.  He has had no complaints of chest  pain during his ED visit.  I talked to the wife and the daughter.  His EKG does not have any acute changes, his delta troponin is negative, and has had no chest pain.  At this point like he can follow-up with his cardiologist in the office soon.  Daughter was concerned and  thought he would need a cardiac catheterization however as I explained to her that he did not meet criteria to get a cardiac catheterization done today.  Final Clinical Impressions(s) / ED Diagnoses   Final diagnoses:  Chest pain, unspecified type  Alcoholic intoxication without complication [facility])    ED Discharge Orders        Ordered    nitroGLYCERIN (NITROSTAT) 0.4 MG SL tablet  Every 5 min PRN     01/13/18 0838      Plan discharge  Devoria Albe, MD, Concha Pyo, MD 01/13/18 [phone]

## 2018-01-23 ENCOUNTER — Encounter: Payer: Self-pay | Admitting: *Deleted

## 2018-01-23 ENCOUNTER — Encounter: Payer: Self-pay | Admitting: Cardiology

## 2018-01-23 ENCOUNTER — Telehealth: Payer: Self-pay | Admitting: Cardiology

## 2018-01-23 ENCOUNTER — Ambulatory Visit: Payer: PRIVATE HEALTH INSURANCE | Admitting: Cardiology

## 2018-01-23 VITALS — BP 128/64 | HR 75 | Ht 72.0 in | Wt 199.0 lb

## 2018-01-23 DIAGNOSIS — I2510 Atherosclerotic heart disease of native coronary artery without angina pectoris: Secondary | ICD-10-CM | POA: Diagnosis not present

## 2018-01-23 DIAGNOSIS — I471 Supraventricular tachycardia: Secondary | ICD-10-CM

## 2018-01-23 DIAGNOSIS — I10 Essential (primary) hypertension: Secondary | ICD-10-CM | POA: Diagnosis not present

## 2018-01-23 DIAGNOSIS — R0789 Other chest pain: Secondary | ICD-10-CM

## 2018-01-23 DIAGNOSIS — E782 Mixed hyperlipidemia: Secondary | ICD-10-CM

## 2018-01-23 MED ORDER — METOPROLOL TARTRATE 25 MG PO TABS: 37.5000 mg | ORAL_TABLET | Freq: Two times a day (BID) | ORAL | 2 refills | Status: DC

## 2018-01-23 MED ORDER — NITROGLYCERIN 0.4 MG SL SUBL: 0.4000 mg | SUBLINGUAL_TABLET | SUBLINGUAL | 2 refills | Status: DC | PRN

## 2018-01-23 NOTE — Telephone Encounter (Signed)
Pre-cert Verification for the following procedure- exercise [facility]

## 2018-01-23 NOTE — Patient Instructions (Addendum)
Medication Instructions:   Your physician has recommended you make the following change in your medication:   Increase metoprolol tartrate to 37.5 mg by mouth twice daily. Please take 1&1/2 of your 25 mg tablet twice daily.  Continue all other medications the same.  Labwork:  NONE  Testing/Procedures: Your physician has requested that you have en exercise stress myoview. For further information please visit [email or web address]. Please follow instruction sheet, as given.  Follow-Up:  Your physician recommends that you schedule a follow-up appointment in: pending test result.  Any Other Special Instructions Will Be Listed Below (If Applicable).  If you need a refill on your cardiac medications before your next appointment, please call your pharmacy.

## 2018-01-23 NOTE — Progress Notes (Signed)
Clinical Summary Wyatt Santiago is a 57 y.o.male seen today for follow up of the following medical problems.   1. CAD - admit 03/2015 with chest pain. Received DES to LCX - 07/2015 echo LVEF 50-55%, abnormal diastolic function  - 12/2017 ER visit with chest pain. Had 12 beers and 6 oz of licquor, intoxicated in ER. Reported some chest pain at that time - trops neg, no acute changes on EKG.   - dull aching pain entire chest, 2/10 in severity. Mainly with activity. Can have palpitations. Denies any SOB or DOE - not positional. Lasts just a few seconds, daily. No relation to food - compliant with meds. Ongoing 6 months, no change in severity or frequency.  - sedentary lifestyle, tolerates he regular activities ok.  2. PSVT - mild palpitations at times.   3. HTN - he is compliant with meds  4. Hyperlipidemia - compliant with statin   Past Medical History:  Diagnosis Date  . Arthritis    "hands" (04/19/2015)  . Coronary artery disease    a. cath 04/19/15 1.  Mid LAD to Dist LAD lesion, 40% stenosed, S/p PCI and DES to prox LCx with LV EF of 45%.  . Hypertension   . Seizures (HCC)    "when I was a kid"  . Sleep apnea    "they say I've got it but I don't wear mask" (04/19/2015)  . SVT (supraventricular tachycardia) (HCC)   . Thrombocytopenia (HCC)      No Known Allergies   Current Outpatient Medications  Medication Sig Dispense Refill  . aspirin 81 MG chewable tablet Chew 1 tablet (81 mg total) by mouth daily.    Marland Kitchen. atorvastatin (LIPITOR) 40 MG tablet TAKE 1 TABLET BY MOUTH EVERY DAY AT 6PM 15 tablet 0  . atorvastatin (LIPITOR) 40 MG tablet TAKE 1 TABLET BY MOUTH EVERY DAY AT 6PM 90 tablet 1  . lisinopril (PRINIVIL,ZESTRIL) 20 MG tablet TAKE 1 TABLET BY MOUTH EVERY DAY 30 tablet 6  . metoprolol tartrate (LOPRESSOR) 25 MG tablet TAKE 1 TABLET (25 MG TOTAL) BY MOUTH 2 (TWO) TIMES DAILY. 180 tablet 2  . nitroGLYCERIN (NITROSTAT) 0.4 MG SL tablet Place 1 tablet (0.4 mg  total) under the tongue every 5 (five) minutes as needed for chest pain. 30 tablet 0   No current facility-administered medications for this visit.      Past Surgical History:  Procedure Laterality Date  . CARDIAC CATHETERIZATION N/A 04/19/2015   Procedure: Left Heart Cath and Coronary Angiography;  Surgeon: Lyn RecordsHenry W Smith, MD;  [facility];  Service: Cardiovascular;  Laterality: N/A;  . CARDIAC CATHETERIZATION N/A 04/19/2015   Procedure: Coronary Stent Intervention;  Surgeon: Lyn RecordsHenry W Smith, MD;  [facility];  Service: Cardiovascular;  Laterality: N/A;  . CORONARY ANGIOPLASTY WITH STENT PLACEMENT  04/19/2015   "1 stent"  . EYE SURGERY Right    "lasered for macular degeneration"     No Known Allergies    Family History  Problem Relation Age of Onset  . Hypertension Father      Social History Wyatt Santiago reports that he has been smoking cigarettes.  He started smoking about 45 years ago. He has a 40.00 pack-year smoking history. He has never used smokeless tobacco. Wyatt Santiago reports that he drinks about 37.8 oz of alcohol per week.   Review of Systems CONSTITUTIONAL: No weight loss, fever, chills, weakness or fatigue.  HEENT: Eyes: No visual loss, blurred vision,  double vision or yellow sclerae.No hearing loss, sneezing, congestion, runny nose or sore throat.  SKIN: No rash or itching.  CARDIOVASCULAR: per hpi RESPIRATORY:per hpi GASTROINTESTINAL: No anorexia, nausea, vomiting or diarrhea. No abdominal pain or blood.  GENITOURINARY: No burning on urination, no polyuria NEUROLOGICAL: No headache, dizziness, syncope, paralysis, ataxia, numbness or tingling in the extremities. No change in bowel or bladder control.  MUSCULOSKELETAL: No muscle, back pain, joint pain or stiffness.  LYMPHATICS: No enlarged nodes. No history of splenectomy.  PSYCHIATRIC: No history of depression or anxiety.  ENDOCRINOLOGIC: No reports of sweating, cold or heat  intolerance. No polyuria or polydipsia.  Marland Kitchen   Physical Examination Vitals:   01/23/18 1320  BP: 128/64  Pulse: 75  SpO2: 98%   Vitals:   01/23/18 1320  Weight: 199 lb (90.3 kg)  Height: 6' (1.829 m)    Gen: resting comfortably, no acute distress HEENT: no scleral icterus, pupils equal round and reactive, no palptable cervical adenopathy,  CV: RRR, no mr/g, no jvd Resp: Clear to auscultation bilaterally GI: abdomen is soft, non-tender, non-distended, normal bowel sounds, no hepatosplenomegaly MSK: extremities are warm, no edema.  Skin: warm, no rash Neuro:  no focal deficits Psych: appropriate affect   Diagnostic Studies 07/2015 echo Study Conclusions  - Left ventricle: The cavity size was at the upper limits of normal. Wall thickness was normal. Systolic function was low normal. The estimated ejection fraction was in the range of 50% to 55%. Mild global hypokinesis. Diastolic dysfunction, indeterminate grade. - Aortic valve: Mildly calcified annulus. - Right atrium: The atrium was mildly dilated.  03/2015 cath 1. Mid LAD to Dist LAD lesion, 40% stenosed. 2. Mid RCA to Dist RCA lesion, 20% stenosed. 3. Ost 1st Mrg lesion, 40% stenosed. 4. Mid Cx-2 lesion, 50% stenosed. 5. Prox Cx lesion, 90% stenosed. There is a 0% residual stenosis post intervention. 6. A drug-eluting stent was placed.   Unstable angina pectoris due to high-grade obstruction in the proximal circumflex with in a tortuous segment.  Successful PCI with Synergy drug-eluting stent implantation and reduction in 90% stenosis to 0% with TIMI grade 3 flow.  Mild residual first obtuse marginal and mid circumflex obstruction less than 50%. Less than 50% obstruction in the mid LAD.  Overall mildly reduced LV function with mid anterior wall hypokinesis and estimated ejection fraction 45%.    Post Cath RECOMMENDATIONS:   Dual antiplatelet therapy (should probably switch to clopidogrel and  aspirin after one month). For the time being and should remain on Brilinta and aspirin.  Potentially able to discontinue gap therapy after 6 months using Synergy.  Potentially eligible for discharge in a.m. assuming no complications.  Ethanol abuse needs to be addressed with the patient and consideration of a plan for cessation instituted.     Assessment and Plan  1. CAD - recent chest pain symptoms, different from his prior chronic atypical symptoms. We will obtain an exercise nuclear stress test.  - . Recently has had some increase in palpitations with episodes, we will increase lopressor to 37.5mg  bid.  - recent EKG 01/13/18 reviewed, SR no acute ischemic changes  2. PSVT - some palpitations at times, increase lopressor to 37.5mg  bid.   3. HTN - at goal, continue current meds  4. Hyperlipidemia - continue statin, request labs from pcp   F/u pending stress result        Antoine Poche, M.D.

## 2018-01-25 ENCOUNTER — Other Ambulatory Visit: Payer: Self-pay | Admitting: Cardiology

## 2018-01-29 ENCOUNTER — Encounter: Payer: Self-pay | Admitting: Cardiology

## 2018-01-30 ENCOUNTER — Encounter (HOSPITAL_COMMUNITY)
Admission: RE | Admit: 2018-01-30 | Discharge: 2018-01-30 | Disposition: A | Discharge status: Home / Self Care | Payer: PRIVATE HEALTH INSURANCE | Source: Ambulatory Visit | Attending: Cardiology | Admitting: Cardiology

## 2018-01-30 ENCOUNTER — Encounter (HOSPITAL_COMMUNITY): Payer: Self-pay

## 2018-01-30 ENCOUNTER — Encounter (HOSPITAL_BASED_OUTPATIENT_CLINIC_OR_DEPARTMENT_OTHER)
Admission: RE | Admit: 2018-01-30 | Discharge: 2018-01-30 | Disposition: A | Discharge status: Home / Self Care | Payer: PRIVATE HEALTH INSURANCE | Source: Ambulatory Visit | Attending: Cardiology | Admitting: Cardiology

## 2018-01-30 DIAGNOSIS — R0789 Other chest pain: Secondary | ICD-10-CM

## 2018-01-30 LAB — NM MYOCAR MULTI W/SPECT W/WALL MOTION / EF
Estimated workload: 10.1 METS
Exercise duration (min): 8 min
Exercise duration (sec): 6 s
LV dias vol: 118 mL (ref 62–150)
LV sys vol: 50 mL
MPHR: 162 {beats}/min
Peak HR: 144 {beats}/min
Percent HR: 88 %
RATE: 0.41
RPE: 15
Rest HR: 65 {beats}/min
SDS: 2
SRS: 0
SSS: 2
TID: 1.02

## 2018-01-30 MED ORDER — REGADENOSON 0.4 MG/5ML IV SOLN
INTRAVENOUS | Status: AC
  Filled 2018-01-30: qty 5

## 2018-01-30 MED ORDER — SODIUM CHLORIDE 0.9% FLUSH
INTRAVENOUS | Status: AC
  Administered 2018-01-30: 10 mL via INTRAVENOUS
  Filled 2018-01-30: qty 10

## 2018-01-30 MED ORDER — TECHNETIUM TC 99M TETROFOSMIN IV KIT
30.0000 | PACK | Freq: Once | INTRAVENOUS | Status: AC | PRN
  Administered 2018-01-30: 33 via INTRAVENOUS

## 2018-01-30 MED ORDER — TECHNETIUM TC 99M TETROFOSMIN IV KIT
10.0000 | PACK | Freq: Once | INTRAVENOUS | Status: AC | PRN
  Administered 2018-01-30: 10.4 via INTRAVENOUS

## 2018-01-31 ENCOUNTER — Telehealth: Payer: Self-pay | Admitting: *Deleted

## 2018-01-31 NOTE — Telephone Encounter (Signed)
-----   Message from Antoine PocheJonathan F Branch, MD sent at 01/31/2018  1:43 PM EDT ----- Stress test looks good, no significant new blockages. F/u 6 weeks to reassess symptoms after recent med changes  Dominga FerryJ Branch MD

## 2018-01-31 NOTE — Telephone Encounter (Signed)
Pt aware and voiced understanding - 6 week f/u scheduled - routed to pcp  

## 2018-03-11 ENCOUNTER — Encounter: Payer: Self-pay | Admitting: Cardiology

## 2018-03-11 ENCOUNTER — Ambulatory Visit: Payer: PRIVATE HEALTH INSURANCE | Admitting: Cardiology

## 2018-03-11 VITALS — BP 111/66 | HR 70 | Ht 72.0 in | Wt 203.4 lb

## 2018-03-11 DIAGNOSIS — I2510 Atherosclerotic heart disease of native coronary artery without angina pectoris: Secondary | ICD-10-CM

## 2018-03-11 NOTE — Progress Notes (Signed)
Clinical Summary Mr. Barich is a 57 y.o.male seen today for follow up of the following medical problems. This is a focused visit on recent symptoms of chest pain.   1. CAD - admit 03/2015 with chest pain. Received DES to LCX - 07/2015 echo LVEF 50-55%, abnormal diastolic function  - 12/2017 ER visit with chest pain. Had 12 beers and 6 oz of licquor, intoxicated in ER. Reported some chest pain at that time - trops neg, no acute changes on EKG.  01/2018 exercise nuclear stress test: exercised 8 min, no clear ischemia by imaging.   - no recent chest pain, no palpitations.         Past Medical History:  Diagnosis Date  . Arthritis    "hands" (04/19/2015)  . Coronary artery disease    a. cath 04/19/15 1.  Mid LAD to Dist LAD lesion, 40% stenosed, S/p PCI and DES to prox LCx with LV EF of 45%.  . Hypertension   . Seizures (HCC)    "when I was a kid"  . Sleep apnea    "they say I've got it but I don't wear mask" (04/19/2015)  . SVT (supraventricular tachycardia) (HCC)   . Thrombocytopenia (HCC)      No Known Allergies   Current Outpatient Medications  Medication Sig Dispense Refill  . aspirin 81 MG chewable tablet Chew 1 tablet (81 mg total) by mouth daily.    Marland Kitchen atorvastatin (LIPITOR) 40 MG tablet TAKE 1 TABLET BY MOUTH EVERY DAY AT 6PM 15 tablet 0  . atorvastatin (LIPITOR) 40 MG tablet TAKE 1 TABLET BY MOUTH EVERY DAY AT 6PM 90 tablet 1  . lisinopril (PRINIVIL,ZESTRIL) 20 MG tablet TAKE 1 TABLET BY MOUTH EVERY DAY 90 tablet 1  . metoprolol tartrate (LOPRESSOR) 25 MG tablet Take 1.5 tablets (37.5 mg total) by mouth 2 (two) times daily. 270 tablet 2  . nitroGLYCERIN (NITROSTAT) 0.4 MG SL tablet Place 1 tablet (0.4 mg total) under the tongue every 5 (five) minutes as needed for chest pain (up to 3 doses. If no relief after 3rd dose, proceed to the ED for an evaluation). 30 tablet 2   No current facility-administered medications for this visit.      Past Surgical History:    Procedure Laterality Date  . CARDIAC CATHETERIZATION N/A 04/19/2015   Procedure: Left Heart Cath and Coronary Angiography;  Surgeon: Lyn Records, MD;  Location: [facility] INVASIVE CV LAB;  Service: Cardiovascular;  Laterality: N/A;  . CARDIAC CATHETERIZATION N/A 04/19/2015   Procedure: Coronary Stent Intervention;  Surgeon: Lyn Records, MD;  Location: [facility] INVASIVE CV LAB;  Service: Cardiovascular;  Laterality: N/A;  . CORONARY ANGIOPLASTY WITH STENT PLACEMENT  04/19/2015   "1 stent"  . EYE SURGERY Right    "lasered for macular degeneration"     No Known Allergies    Family History  Problem Relation Age of Onset  . Hypertension Father      Social History Mr. Stockinger reports that he has been smoking cigarettes.  He started smoking about 45 years ago. He has a 40.00 pack-year smoking history. He has never used smokeless tobacco. Mr. Leino reports that he drinks about 37.8 oz of alcohol per week.   Review of Systems CONSTITUTIONAL: No weight loss, fever, chills, weakness or fatigue.  HEENT: Eyes: No visual loss, blurred vision, double vision or yellow sclerae.No hearing loss, sneezing, congestion, runny nose or sore throat.  SKIN: No rash or itching.  CARDIOVASCULAR: per hpi  RESPIRATORY: No shortness of breath, cough or sputum.  GASTROINTESTINAL: No anorexia, nausea, vomiting or diarrhea. No abdominal pain or blood.  GENITOURINARY: No burning on urination, no polyuria NEUROLOGICAL: No headache, dizziness, syncope, paralysis, ataxia, numbness or tingling in the extremities. No change in bowel or bladder control.  MUSCULOSKELETAL: No muscle, back pain, joint pain or stiffness.  LYMPHATICS: No enlarged nodes. No history of splenectomy.  PSYCHIATRIC: No history of depression or anxiety.  ENDOCRINOLOGIC: No reports of sweating, cold or heat intolerance. No polyuria or polydipsia.  Marland Kitchen.   Physical Examination Vitals:   03/11/18 1456  BP: 111/66  Pulse: 70  SpO2: 98%   Filed Weights    03/11/18 1456  Weight: 203 lb 6.4 oz (92.3 kg)    Gen: resting comfortably, no acute distress HEENT: no scleral icterus, pupils equal round and reactive, no palptable cervical adenopathy,  CV: RRR, no m/r/g, no jvd Resp: Clear to auscultation bilaterally GI: abdomen is soft, non-tender, non-distended, normal bowel sounds, no hepatosplenomegaly MSK: extremities are warm, no edema.  Skin: warm, no rash Neuro:  no focal deficits Psych: appropriate affect   Diagnostic Studies 07/2015 echo Study Conclusions  - Left ventricle: The cavity size was at the upper limits of normal. Wall thickness was normal. Systolic function was low normal. The estimated ejection fraction was in the range of 50% to 55%. Mild global hypokinesis. Diastolic dysfunction, indeterminate grade. - Aortic valve: Mildly calcified annulus. - Right atrium: The atrium was mildly dilated.  03/2015 cath 1. Mid LAD to Dist LAD lesion, 40% stenosed. 2. Mid RCA to Dist RCA lesion, 20% stenosed. 3. Ost 1st Mrg lesion, 40% stenosed. 4. Mid Cx-2 lesion, 50% stenosed. 5. Prox Cx lesion, 90% stenosed. There is a 0% residual stenosis post intervention. 6. A drug-eluting stent was placed.   Unstable angina pectoris due to high-grade obstruction in the proximal circumflex with in a tortuous segment.  Successful PCI with Synergy drug-eluting stent implantation and reduction in 90% stenosis to 0% with TIMI grade 3 flow.  Mild residual first obtuse marginal and mid circumflex obstruction less than 50%. Less than 50% obstruction in the mid LAD.  Overall mildly reduced LV function with mid anterior wall hypokinesis and estimated ejection fraction 45%.    Post Cath RECOMMENDATIONS:   Dual antiplatelet therapy (should probably switch to clopidogrel and aspirin after one month). For the time being and should remain on Brilinta and aspirin.  Potentially able to discontinue gap therapy after 6 months using  Synergy.  Potentially eligible for discharge in a.m. assuming no complications.  Ethanol abuse needs to be addressed with the patient and consideration of a plan for cessation instituted.  01/2018 nuclear stress test  Blood pressure demonstrated a hypertensive response to exercise.  Nonspecific ST changes with stress which resolved in recovery (leads II and III).  Defect 1: There is a medium defect of mild severity present in the mid inferoseptal and mid inferior location. This appears to be due to soft tissue attenuation. There were no significant ischemic territories.  This is a low risk study.  Nuclear stress EF: 58%.   Assessment and Plan  1. CAD/other chest pain - recent nuclear stress without clear ischemia. Symptoms have resolved since last visit - continue current medical therapy.        Antoine PocheJonathan F. Donnette Macmullen, M.D.

## 2018-03-11 NOTE — Patient Instructions (Signed)

## 2018-03-20 ENCOUNTER — Encounter: Payer: Self-pay | Admitting: Cardiology

## 2018-07-22 ENCOUNTER — Other Ambulatory Visit: Payer: Self-pay | Admitting: Cardiology

## 2018-11-08 ENCOUNTER — Other Ambulatory Visit: Payer: Self-pay

## 2018-11-08 ENCOUNTER — Ambulatory Visit (INDEPENDENT_AMBULATORY_CARE_PROVIDER_SITE_OTHER): Payer: PRIVATE HEALTH INSURANCE | Admitting: Podiatry

## 2018-11-08 ENCOUNTER — Encounter: Payer: Self-pay | Admitting: Podiatry

## 2018-11-08 DIAGNOSIS — E0843 Diabetes mellitus due to underlying condition with diabetic autonomic (poly)neuropathy: Secondary | ICD-10-CM | POA: Diagnosis not present

## 2018-11-08 DIAGNOSIS — L97522 Non-pressure chronic ulcer of other part of left foot with fat layer exposed: Secondary | ICD-10-CM

## 2018-11-08 MED ORDER — GENTAMICIN SULFATE 0.1 % EX CREA: 1.0000 "application " | TOPICAL_CREAM | Freq: Two times a day (BID) | CUTANEOUS | 1 refills | Status: DC

## 2018-11-08 MED ORDER — DOXYCYCLINE HYCLATE 100 MG PO TABS
100.0000 mg | ORAL_TABLET | Freq: Two times a day (BID) | ORAL | 0 refills | Status: DC
Start: 2018-11-08 — End: 2020-07-01

## 2018-11-12 NOTE — Progress Notes (Signed)
   Subjective:  58 y.o. male with PMHx of diabetes mellitus presenting today as a new patient with a chief complaint of painful lesions noted to the plantar aspect of the left foot that appeared about one month ago. He states he believes they are plantar warts and has been using OTC medications with no significant relief. Walking increases the pain. Patient is here for further evaluation and treatment.   Past Medical History:  Diagnosis Date  . Arthritis    "hands" (04/19/2015)  . Coronary artery disease    a. cath 04/19/15 1.  Mid LAD to Dist LAD lesion, 40% stenosed, S/p PCI and DES to prox LCx with LV EF of 45%.  . Hypertension   . Seizures (HCC)    "when I was a kid"  . Sleep apnea    "they say I've got it but I don't wear mask" (04/19/2015)  . SVT (supraventricular tachycardia) (HCC)   . Thrombocytopenia (HCC)       Objective/Physical Exam General: The patient is alert and oriented x3 in no acute distress.  Dermatology:  Wound #1 noted to the left heel measuring 1.0 x 1.0 x 0.3 cm (LxWxD).   Wound #2 noted to the left plantar forefoot measuring 1.0 x 1.0 x 0.3 cm.   To the noted ulceration(s), there is no eschar. There is a moderate amount of slough, fibrin, and necrotic tissue noted. Granulation tissue and wound base is red. There is a minimal amount of serosanguineous drainage noted. There is no exposed bone muscle-tendon ligament or joint. There is no malodor. Periwound integrity is intact. Skin is warm, dry and supple bilateral lower extremities.  Vascular: Palpable pedal pulses bilaterally. No edema or erythema noted. Capillary refill within normal limits.  Neurological: Epicritic and protective threshold diminished bilaterally.   Musculoskeletal Exam: Range of motion within normal limits to all pedal and ankle joints bilateral. Muscle strength 5/5 in all groups bilateral.   Assessment: 1. Ulceration of the left heel secondary to diabetes mellitus 2. Ulceration of the  left plantar forefoot secondary to diabetes mellitus  3. diabetes mellitus w/ peripheral neuropathy   Plan of Care:  1. Patient was evaluated. 2. medically necessary excisional debridement including subcutaneous tissue was performed using a tissue nipper and a chisel blade. Excisional debridement of all the necrotic nonviable tissue down to healthy bleeding viable tissue was performed with post-debridement measurements same as pre-. 3. the wound was cleansed and dry sterile dressing applied. 4. Prescription for Doxycycline 100 mg #20 provided to patient.  5. Prescription for Gentamicin cream provided to patient to use daily with a bandage.  6. patient is to return to clinic in 3 weeks.  [employer] administrator for [employer].    Felecia Shelling, DPM [facility]  Dr. Felecia Shelling, DPM    [address]                                        [place], [place] [address]                Office [phone]  Fax [phone]

## 2018-12-10 ENCOUNTER — Ambulatory Visit: Payer: PRIVATE HEALTH INSURANCE | Admitting: Podiatry

## 2018-12-13 ENCOUNTER — Other Ambulatory Visit: Payer: Self-pay

## 2018-12-13 ENCOUNTER — Ambulatory Visit: Payer: PRIVATE HEALTH INSURANCE | Admitting: Podiatry

## 2018-12-13 ENCOUNTER — Encounter: Payer: Self-pay | Admitting: Podiatry

## 2018-12-13 VITALS — Temp 96.4°F

## 2018-12-13 DIAGNOSIS — M7742 Metatarsalgia, left foot: Secondary | ICD-10-CM

## 2018-12-13 DIAGNOSIS — E0843 Diabetes mellitus due to underlying condition with diabetic autonomic (poly)neuropathy: Secondary | ICD-10-CM | POA: Diagnosis not present

## 2018-12-13 DIAGNOSIS — L97522 Non-pressure chronic ulcer of other part of left foot with fat layer exposed: Secondary | ICD-10-CM

## 2018-12-13 NOTE — Progress Notes (Signed)
   Subjective:  58 y.o. male with PMHx of diabetes mellitus presenting today for follow-up treatment evaluation regarding ulcers to the left foot.  Patient took his oral antibiotics and also has been applying antibiotic cream and a Band-Aid.   He does have a new complaint today stating that after working on his feet after a few hours he experiences significant pain and tenderness to the left lateral forefoot.  He has to rest his feet and take his shoes off and then they feel immediately better.  He denies injury or any change in activity.  Alleviated by rest.  He presents for further treatment evaluation  Past Medical History:  Diagnosis Date  . Arthritis    "hands" (04/19/2015)  . Coronary artery disease    a. cath 04/19/15 1.  Mid LAD to Dist LAD lesion, 40% stenosed, S/p PCI and DES to prox LCx with LV EF of 45%.  . Hypertension   . Seizures (HCC)    "when I was a kid"  . Sleep apnea    "they say I've got it but I don't wear mask" (04/19/2015)  . SVT (supraventricular tachycardia) (HCC)   . Thrombocytopenia (HCC)       Objective/Physical Exam General: The patient is alert and oriented x3 in no acute distress.  Dermatology:  Wounds noted to the left foot have healed.  Complete reepithelialization has occurred.  Skin is warm, dry and supple bilateral lower extremities.  Vascular: Palpable pedal pulses bilaterally. No edema or erythema noted. Capillary refill within normal limits.  Neurological: Epicritic and protective threshold diminished bilaterally.   Musculoskeletal Exam: Range of motion within normal limits to all pedal and ankle joints bilateral. Muscle strength 5/5 in all groups bilateral.   Assessment: 1. Ulceration of the left heel secondary to diabetes mellitus-healed 2. Ulceration of the left plantar forefoot secondary to diabetes mellitus-healed 3. diabetes mellitus w/ peripheral neuropathy 4.  Metatarsalgia left lateral forefoot   Plan of Care:  1. Patient was  evaluated. 2.  Light debridement was performed using a chisel blade of the hyperkeratotic callus tissue around the healed ulceration site. 3.  Offloading metatarsal pads dispensed to the patient.  I informed the patient that the if this helps to alleviate his symptoms he would benefit from custom molded insoles with metatarsal pads. 4.  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    Felecia Shelling, DPM [facility]  Dr. Felecia Shelling, DPM    [address]                                        [place], [place] [address]                Office [phone]  Fax [phone]

## 2019-02-25 ENCOUNTER — Other Ambulatory Visit: Payer: Self-pay | Admitting: Cardiology

## 2019-03-22 ENCOUNTER — Other Ambulatory Visit: Payer: Self-pay | Admitting: Cardiology

## 2019-06-30 ENCOUNTER — Other Ambulatory Visit: Payer: Self-pay

## 2019-06-30 DIAGNOSIS — Z20822 Contact with and (suspected) exposure to covid-19: Secondary | ICD-10-CM

## 2019-07-01 LAB — NOVEL CORONAVIRUS, NAA: SARS-CoV-2, NAA: NOT DETECTED

## 2019-07-08 ENCOUNTER — Other Ambulatory Visit: Payer: Self-pay | Admitting: Cardiology

## 2020-03-09 ENCOUNTER — Ambulatory Visit (HOSPITAL_COMMUNITY)
Admission: RE | Admit: 2020-03-09 | Discharge: 2020-03-09 | Disposition: A | Discharge status: Home / Self Care | Payer: PRIVATE HEALTH INSURANCE | Source: Ambulatory Visit | Attending: Physician Assistant | Admitting: Physician Assistant

## 2020-03-09 ENCOUNTER — Encounter: Payer: Self-pay | Admitting: Cardiology

## 2020-03-09 ENCOUNTER — Other Ambulatory Visit: Payer: Self-pay

## 2020-03-09 ENCOUNTER — Other Ambulatory Visit (HOSPITAL_COMMUNITY): Payer: Self-pay | Admitting: Physician Assistant

## 2020-03-09 DIAGNOSIS — R5383 Other fatigue: Secondary | ICD-10-CM | POA: Diagnosis present

## 2020-03-09 DIAGNOSIS — R05 Cough: Secondary | ICD-10-CM | POA: Insufficient documentation

## 2020-03-09 DIAGNOSIS — R059 Cough, unspecified: Secondary | ICD-10-CM

## 2020-06-30 ENCOUNTER — Emergency Department (HOSPITAL_COMMUNITY): Payer: PRIVATE HEALTH INSURANCE

## 2020-06-30 ENCOUNTER — Encounter (HOSPITAL_COMMUNITY): Payer: Self-pay

## 2020-06-30 ENCOUNTER — Observation Stay (HOSPITAL_COMMUNITY)
Admission: EM | Admit: 2020-06-30 | Discharge: 2020-07-02 | Disposition: A | Discharge status: Home / Self Care | Payer: PRIVATE HEALTH INSURANCE | Attending: Internal Medicine | Admitting: Internal Medicine

## 2020-06-30 DIAGNOSIS — I10 Essential (primary) hypertension: Secondary | ICD-10-CM | POA: Insufficient documentation

## 2020-06-30 DIAGNOSIS — I2510 Atherosclerotic heart disease of native coronary artery without angina pectoris: Secondary | ICD-10-CM | POA: Insufficient documentation

## 2020-06-30 DIAGNOSIS — Z20822 Contact with and (suspected) exposure to covid-19: Secondary | ICD-10-CM | POA: Diagnosis not present

## 2020-06-30 DIAGNOSIS — R0789 Other chest pain: Principal | ICD-10-CM | POA: Insufficient documentation

## 2020-06-30 DIAGNOSIS — F17210 Nicotine dependence, cigarettes, uncomplicated: Secondary | ICD-10-CM | POA: Insufficient documentation

## 2020-06-30 DIAGNOSIS — F1010 Alcohol abuse, uncomplicated: Secondary | ICD-10-CM | POA: Diagnosis present

## 2020-06-30 DIAGNOSIS — R079 Chest pain, unspecified: Secondary | ICD-10-CM | POA: Diagnosis present

## 2020-06-30 DIAGNOSIS — Z955 Presence of coronary angioplasty implant and graft: Secondary | ICD-10-CM

## 2020-06-30 LAB — TROPONIN I (HIGH SENSITIVITY)
Troponin I (High Sensitivity): 2 ng/L (ref <18)
Troponin I (High Sensitivity): 3 ng/L (ref <18)

## 2020-06-30 LAB — CBC
HCT: 45.3 % (ref 39.0–52.0)
Hemoglobin: 14.7 g/dL (ref 13.0–17.0)
MCH: 29.6 pg (ref 26.0–34.0)
MCHC: 32.5 g/dL (ref 30.0–36.0)
MCV: 91.1 fL (ref 80.0–100.0)
Platelets: 117 10*3/uL — ABNORMAL LOW (ref 150–400)
RBC: 4.97 MIL/uL (ref 4.22–5.81)
RDW: 14.2 % (ref 11.5–15.5)
WBC: 5.1 10*3/uL (ref 4.0–10.5)
nRBC: 0 % (ref 0.0–0.2)

## 2020-06-30 LAB — RESPIRATORY PANEL BY RT PCR (FLU A&B, COVID)
Influenza A by PCR: NEGATIVE
Influenza B by PCR: NEGATIVE
SARS Coronavirus 2 by RT PCR: NEGATIVE

## 2020-06-30 LAB — BASIC METABOLIC PANEL
Anion gap: 9 (ref 5–15)
BUN: 16 mg/dL (ref 8–23)
CO2: 25 mmol/L (ref 22–32)
Calcium: 9.3 mg/dL (ref 8.9–10.3)
Chloride: 106 mmol/L (ref 98–111)
Creatinine, Ser: 1.04 mg/dL (ref 0.61–1.24)
GFR, Estimated: >60 mL/min (ref >60)
Glucose, Bld: 108 mg/dL — ABNORMAL HIGH (ref 70–99)
Potassium: 4 mmol/L (ref 3.5–5.1)
Sodium: 140 mmol/L (ref 135–145)

## 2020-06-30 MED ORDER — ACETAMINOPHEN 325 MG PO TABS: 650.0000 mg | ORAL_TABLET | ORAL | Status: DC | PRN

## 2020-06-30 MED ORDER — NICOTINE 21 MG/24HR TD PT24
21.0000 mg | MEDICATED_PATCH | Freq: Every day | TRANSDERMAL | Status: DC
  Administered 2020-07-01 – 2020-07-02 (×3): 21 mg via TRANSDERMAL
  Filled 2020-06-30 (×3): qty 1

## 2020-06-30 MED ORDER — IOHEXOL 350 MG/ML SOLN
80.0000 mL | Freq: Once | INTRAVENOUS | Status: AC | PRN
  Administered 2020-06-30: 80 mL via INTRAVENOUS

## 2020-06-30 MED ORDER — INSULIN ASPART 100 UNIT/ML ~~LOC~~ SOLN: 0.0000 [IU] | SUBCUTANEOUS | Status: DC

## 2020-06-30 MED ORDER — ENOXAPARIN SODIUM 40 MG/0.4ML ~~LOC~~ SOLN
40.0000 mg | SUBCUTANEOUS | Status: DC
  Administered 2020-07-01: 40 mg via SUBCUTANEOUS
  Filled 2020-06-30: qty 0.4

## 2020-06-30 MED ORDER — MORPHINE SULFATE (PF) 4 MG/ML IV SOLN
4.0000 mg | Freq: Once | INTRAVENOUS | Status: AC
  Administered 2020-06-30: 4 mg via INTRAVENOUS
  Filled 2020-06-30: qty 1

## 2020-06-30 MED ORDER — ASPIRIN 81 MG PO CHEW
324.0000 mg | CHEWABLE_TABLET | Freq: Once | ORAL | Status: AC
  Administered 2020-06-30: 324 mg via ORAL
  Filled 2020-06-30: qty 4

## 2020-06-30 MED ORDER — ONDANSETRON HCL 4 MG/2ML IJ SOLN: 4.0000 mg | Freq: Four times a day (QID) | INTRAMUSCULAR | Status: DC | PRN

## 2020-06-30 NOTE — ED Provider Notes (Signed)
[facility] EMERGENCY DEPARTMENT Provider Note   CSN: [id number] Arrival date & time: 06/30/20  1935     History Chief Complaint  Patient presents with  . Chest Pain    Wyatt Santiago is a 59 y.o. male.  The history is provided by the patient and medical records. No language interpreter was used.  Chest Pain    59 year old male significant history of hypertension, SVT, alcohol use, CAD, presenting for evaluation of chest pain. Patient report he was awoke this morning around 2 AM with pain to his chest. Described pain as an uncomfortable sensation to his mid chest with associated shortness of breath. He also reports some pleuritic component to his pain. Pain has been waxing waning but has increased in severity. At this time pain did improve and rates as 2 out of 10. He noticed increasing pain with exertion. He does not complain of any fever or chills no runny nose sneezing or coughing no sore throat no nausea vomiting and diarrhea no dizziness lightheadedness or diaphoresis. Denies having abdominal pain or back pain. He did report having pain to his right leg ongoing for several weeks but felt that it may be related to his knee. He has not noticed any leg swelling. Denies a history of PE or DVT. He is a heavy tobacco user smoking approximately 2 packs a day. He does admits to alcohol use, last use was yesterday. He has been fully vaccinated for COVID-19. His last cardiac cath was in 2016 showing CAD. He does have cardiac stenting but currently not on any anticoagulant.  Past Medical History:  Diagnosis Date  . Arthritis    "hands" (04/19/2015)  . Coronary artery disease    a. cath 04/19/15 1.  Mid LAD to Dist LAD lesion, 40% stenosed, S/p PCI and DES to prox LCx with LV EF of 45%.  . Hypertension   . Seizures (HCC)    "when I was a kid"  . Sleep apnea    "they say I've got it but I don't wear mask" (04/19/2015)  . SVT (supraventricular tachycardia) (HCC)   .  Thrombocytopenia [facility])     Patient Active Problem List   Diagnosis Date Noted  . Unstable angina pectoris (HCC) 04/19/2015  . Unstable angina (HCC) 04/19/2015  . SVT (supraventricular tachycardia) (HCC) 12/01/2013  . HTN (hypertension) 12/01/2013  . Obstructive sleep apnea 12/01/2013  . Alcohol abuse 12/01/2013  . Cigarette smoker 12/01/2013    Past Surgical History:  Procedure Laterality Date  . CARDIAC CATHETERIZATION N/A 04/19/2015   Procedure: Left Heart Cath and Coronary Angiography;  Surgeon: Lyn Records, MD;  Location: [facility] INVASIVE CV LAB;  Service: Cardiovascular;  Laterality: N/A;  . CARDIAC CATHETERIZATION N/A 04/19/2015   Procedure: Coronary Stent Intervention;  Surgeon: Lyn Records, MD;  Location: [facility] INVASIVE CV LAB;  Service: Cardiovascular;  Laterality: N/A;  . CORONARY ANGIOPLASTY WITH STENT PLACEMENT  04/19/2015   "1 stent"  . EYE SURGERY Right    "lasered for macular degeneration"       Family History  Problem Relation Age of Onset  . Hypertension Father     Social History   Tobacco Use  . Smoking status: Current Every Day Smoker    Packs/day: 1.00    Years: 40.00    Pack years: 40.00    Types: Cigarettes    Start date: 04/28/1972  . Smokeless tobacco: Never Used  Substance Use Topics  . Alcohol use: Yes    Alcohol/week:  63.0 standard drinks    Types: 63 Cans of beer per week    Comment: 04/19/2015 Drinks about a 8-9 beers/day.  . Drug use: No    Home Medications Prior to Admission medications   Medication Sig Start Date End Date Taking? Authorizing Provider  aspirin 81 MG chewable tablet Chew 1 tablet (81 mg total) by mouth daily. 04/20/15   Bhagat, Sharrell Ku, PA  atorvastatin (LIPITOR) 40 MG tablet TAKE 1 TABLET BY MOUTH EVERY DAY AT 6PM 06/20/17   Antoine Poche, MD  atorvastatin (LIPITOR) 40 MG tablet TAKE 1 TABLET BY MOUTH EVERY DAY AT [facility] 07/22/18   Antoine Poche, MD  doxycycline (VIBRA-TABS) 100 MG tablet Take 1 tablet (100 mg  total) by mouth 2 (two) times daily. 11/08/18   Felecia Shelling, DPM  gentamicin cream (GARAMYCIN) 0.1 % Apply 1 application topically 2 (two) times daily. 11/08/18   Felecia Shelling, DPM  lisinopril (ZESTRIL) 20 MG tablet TAKE 1 TABLET BY MOUTH EVERY DAY 07/08/19   Antoine Poche, MD  metFORMIN (GLUCOPHAGE) 500 MG tablet Take 500 mg by mouth 2 (two) times daily. 11/01/18   [provider]  metoprolol tartrate (LOPRESSOR) 25 MG tablet Take 1.5 tablets (37.5 mg total) by mouth 2 (two) times daily. 01/23/18   Antoine Poche, MD  nitroGLYCERIN (NITROSTAT) 0.4 MG SL tablet Place 1 tablet (0.4 mg total) under the tongue every 5 (five) minutes as needed for chest pain (up to 3 doses. If no relief after 3rd dose, proceed to the ED for an evaluation). 01/23/18   Antoine Poche, MD    Allergies    Patient has no known allergies.  Review of Systems   Review of Systems  Cardiovascular: Positive for chest pain.  All other systems reviewed and are negative.   Physical Exam Updated Vital Signs BP (!) 171/103   Pulse (!) 113   Temp 98.5 F (36.9 C) (Oral)   Resp 18   SpO2 98%   Physical Exam Vitals and nursing note reviewed.  Constitutional:      General: He is not in acute distress.    Appearance: He is well-developed.  HENT:     Head: Atraumatic.  Eyes:     Conjunctiva/sclera: Conjunctivae normal.  Cardiovascular:     Rate and Rhythm: Tachycardia present. Rhythm irregular.     Heart sounds: Normal heart sounds. No murmur heard.   Pulmonary:     Breath sounds: No wheezing, rhonchi or rales.  Chest:     Chest wall: No tenderness.  Abdominal:     Palpations: Abdomen is soft.     Tenderness: There is no abdominal tenderness.  Musculoskeletal:     Cervical back: Neck supple.     Right lower leg: No edema.     Left lower leg: No edema.  Skin:    Findings: No rash.  Neurological:     Mental Status: He is alert and oriented to person, place, and time.  Psychiatric:         Mood and Affect: Mood normal.     ED Results / Procedures / Treatments   Labs (all labs ordered are listed, but only abnormal results are displayed) Labs Reviewed  BASIC METABOLIC PANEL - Abnormal; Notable for the following components:      Result Value   Glucose, Bld 108 (*)    All other components within normal limits  CBC - Abnormal; Notable for the following components:   Platelets 117 (*)  All other components within normal limits  RESPIRATORY PANEL BY RT PCR (FLU A&B, COVID)  TROPONIN I (HIGH SENSITIVITY)  TROPONIN I (HIGH SENSITIVITY)    EKG EKG Interpretation  Date/Time:  Wednesday June 30 2020 19:38:50 EST Ventricular Rate:  114 PR Interval:  158 QRS Duration: 86 QT Interval:  362 QTC Calculation: 498 R Axis:   -22 Text Interpretation: Sinus tachycardia with occasional Premature ventricular complexes Cannot rule out Anterior infarct , age undetermined T wave abnormality, consider lateral ischemia Abnormal ECG No significant change since last tracing Confirmed by Richardean CanalYao, David H [id number]) on 06/30/2020 8:46:26 PM   Radiology DG Chest 2 View  Result Date: 06/30/2020 CLINICAL DATA:  Chest pain EXAM: CHEST - 2 VIEW COMPARISON:  03/09/2020 FINDINGS: Normal heart size. No pleural effusion or edema identified. No airspace densities identified. Spondylosis identified within the thoracic spine. IMPRESSION: No acute cardiopulmonary abnormalities. Electronically Signed   By: Signa Kellaylor  Stroud M.D.   On: 06/30/2020 20:00    Procedures Procedures (including critical care time)  Medications Ordered in ED Medications  morphine 4 MG/ML injection 4 mg (4 mg Intravenous Given 06/30/20 2106)  aspirin chewable tablet 324 mg (324 mg Oral Given 06/30/20 2106)  iohexol (OMNIPAQUE) 350 MG/ML injection 80 mL (80 mLs Intravenous Contrast Given 06/30/20 2303)    ED Course  I have reviewed the triage vital signs and the nursing notes.  Pertinent labs & imaging results that were  available during my care of the patient were reviewed by me and considered in my medical decision making (see chart for details).    MDM Rules/Calculators/A&P                          BP 126/75   Pulse 94   Temp 98.5 F (36.9 C) (Oral)   Resp 17   SpO2 96%   Final Clinical Impression(s) / ED Diagnoses Final diagnoses:  Nonspecific chest pain    Rx / DC Orders ED Discharge Orders    None     8:55 PM Patient with significant history of cardiac disease here with acute onset of substernal chest pain this morning that has been persistent but is improving. He also endorsed some pleuritic chest pain and shortness of breath and is found to be tachycardic with heart rate of 113. He is not hypoxic. He does complain of right leg pain for the past several weeks but leg exam unremarkable no signs of infection. No significant edema about the right lower leg. Patient will benefit from either a D-dimer or chest CTA to rule out PE. Heart score is 4, moderate risk of MACE.  11:42 PM Delta troponin is normal.  Covid test is negative.  Electrolytes, H&H, and WBC within normal limit.  CT angiogram show no evidence of PE and no acute intrathoracic process.  There is severe coronary artery atherosclerosis noted.  Patient did receive aspirin, and morphine upon arrival and at this time he is symptom-free and resting comfortably.  Vital signs stable.  Patient mention the previous time that he has chest pain he had to get cardiac stenting.  With moderate risk of MACE, will consult for admission for chest pain rule out.  11:47 PM Appreciate consultation from [facility] Dr. Julian ReilGardner who agrees to see and admit pt for c/p rule out.   Wyatt Santiago was evaluated in Emergency Department on 06/30/2020 for the symptoms described in the history of present illness. He was evaluated in the  context of the global COVID-19 pandemic, which necessitated consideration that the patient might be at risk for infection  with the SARS-CoV-2 virus that causes COVID-19. Institutional protocols and algorithms that pertain to the evaluation of patients at risk for COVID-19 are in a state of rapid change based on information released by regulatory bodies including the CDC and federal and state organizations. These policies and algorithms were followed during the patient's care in the ED.    Fayrene Helper, PA-C 06/30/20 2347    Charlynne Pander, MD 07/02/20 [phone]

## 2020-06-30 NOTE — ED Triage Notes (Signed)
Pt states that he has been having sharp central CP with SOB, radiation to neck that woke him from his sleep at 2am, pain is worse with movement. Hx of stents

## 2020-07-01 ENCOUNTER — Ambulatory Visit (HOSPITAL_COMMUNITY)
Admission: EM | Disposition: A | Discharge status: Home / Self Care | Payer: Self-pay | Source: Home / Self Care | Attending: Emergency Medicine

## 2020-07-01 ENCOUNTER — Encounter (HOSPITAL_COMMUNITY): Payer: Self-pay | Admitting: Internal Medicine

## 2020-07-01 ENCOUNTER — Observation Stay (HOSPITAL_BASED_OUTPATIENT_CLINIC_OR_DEPARTMENT_OTHER): Payer: PRIVATE HEALTH INSURANCE

## 2020-07-01 DIAGNOSIS — I10 Essential (primary) hypertension: Secondary | ICD-10-CM

## 2020-07-01 DIAGNOSIS — I25110 Atherosclerotic heart disease of native coronary artery with unstable angina pectoris: Secondary | ICD-10-CM

## 2020-07-01 DIAGNOSIS — I25119 Atherosclerotic heart disease of native coronary artery with unspecified angina pectoris: Secondary | ICD-10-CM | POA: Diagnosis not present

## 2020-07-01 DIAGNOSIS — F1010 Alcohol abuse, uncomplicated: Secondary | ICD-10-CM | POA: Diagnosis not present

## 2020-07-01 DIAGNOSIS — F17210 Nicotine dependence, cigarettes, uncomplicated: Secondary | ICD-10-CM

## 2020-07-01 DIAGNOSIS — R079 Chest pain, unspecified: Secondary | ICD-10-CM | POA: Diagnosis not present

## 2020-07-01 DIAGNOSIS — I2510 Atherosclerotic heart disease of native coronary artery without angina pectoris: Secondary | ICD-10-CM | POA: Diagnosis present

## 2020-07-01 HISTORY — PX: LEFT HEART CATH AND CORONARY ANGIOGRAPHY: CATH118249

## 2020-07-01 HISTORY — PX: CORONARY STENT INTERVENTION: CATH118234

## 2020-07-01 LAB — HEMOGLOBIN A1C
Hgb A1c MFr Bld: 5 % (ref 4.8–5.6)
Mean Plasma Glucose: 96.8 mg/dL

## 2020-07-01 LAB — POCT ACTIVATED CLOTTING TIME: Activated Clotting Time: 290 seconds

## 2020-07-01 LAB — CBG MONITORING, ED
Glucose-Capillary: 102 mg/dL — ABNORMAL HIGH (ref 70–99)
Glucose-Capillary: 85 mg/dL (ref 70–99)
Glucose-Capillary: 85 mg/dL (ref 70–99)
Glucose-Capillary: 91 mg/dL (ref 70–99)
Glucose-Capillary: 95 mg/dL (ref 70–99)

## 2020-07-01 LAB — GLUCOSE, CAPILLARY: Glucose-Capillary: 171 mg/dL — ABNORMAL HIGH (ref 70–99)

## 2020-07-01 LAB — ECHOCARDIOGRAM LIMITED
Area-P 1/2: 3.77 cm2
S' Lateral: 3.9 cm

## 2020-07-01 LAB — HIV ANTIBODY (ROUTINE TESTING W REFLEX): HIV Screen 4th Generation wRfx: NONREACTIVE

## 2020-07-01 SURGERY — LEFT HEART CATH AND CORONARY ANGIOGRAPHY
Anesthesia: LOCAL

## 2020-07-01 MED ORDER — SODIUM CHLORIDE 0.9% FLUSH: 3.0000 mL | INTRAVENOUS | Status: DC | PRN

## 2020-07-01 MED ORDER — HEPARIN (PORCINE) IN NACL 1000-0.9 UT/500ML-% IV SOLN
INTRAVENOUS | Status: AC
  Filled 2020-07-01: qty 1000

## 2020-07-01 MED ORDER — FAMOTIDINE IN NACL 20-0.9 MG/50ML-% IV SOLN
INTRAVENOUS | Status: AC | PRN
  Administered 2020-07-01: 20 mg via INTRAVENOUS

## 2020-07-01 MED ORDER — CLOPIDOGREL BISULFATE 300 MG PO TABS
ORAL_TABLET | ORAL | Status: AC
  Filled 2020-07-01: qty 1

## 2020-07-01 MED ORDER — SODIUM CHLORIDE 0.9 % IV SOLN: INTRAVENOUS | Status: AC

## 2020-07-01 MED ORDER — HEPARIN SODIUM (PORCINE) 1000 UNIT/ML IJ SOLN
INTRAMUSCULAR | Status: DC | PRN
  Administered 2020-07-01 (×2): 5000 [IU] via INTRAVENOUS

## 2020-07-01 MED ORDER — FAMOTIDINE IN NACL 20-0.9 MG/50ML-% IV SOLN
INTRAVENOUS | Status: AC
  Filled 2020-07-01: qty 50

## 2020-07-01 MED ORDER — CLOPIDOGREL BISULFATE 300 MG PO TABS
ORAL_TABLET | ORAL | Status: DC | PRN
  Administered 2020-07-01: 600 mg via ORAL

## 2020-07-01 MED ORDER — FENTANYL CITRATE (PF) 100 MCG/2ML IJ SOLN
INTRAMUSCULAR | Status: DC | PRN
Start: 2020-07-01 — End: 2020-07-01
  Administered 2020-07-01: 50 ug via INTRAVENOUS

## 2020-07-01 MED ORDER — MIDAZOLAM HCL 2 MG/2ML IJ SOLN
INTRAMUSCULAR | Status: AC
  Filled 2020-07-01: qty 2

## 2020-07-01 MED ORDER — FENTANYL CITRATE (PF) 100 MCG/2ML IJ SOLN
INTRAMUSCULAR | Status: AC
  Filled 2020-07-01: qty 2

## 2020-07-01 MED ORDER — MIDAZOLAM HCL 2 MG/2ML IJ SOLN
INTRAMUSCULAR | Status: DC | PRN
  Administered 2020-07-01: 2 mg via INTRAVENOUS

## 2020-07-01 MED ORDER — GABAPENTIN 300 MG PO CAPS
300.0000 mg | ORAL_CAPSULE | Freq: Every day | ORAL | Status: DC
  Administered 2020-07-01 – 2020-07-02 (×2): 300 mg via ORAL
  Filled 2020-07-01 (×2): qty 1

## 2020-07-01 MED ORDER — VERAPAMIL HCL 2.5 MG/ML IV SOLN
INTRAVENOUS | Status: DC | PRN
  Administered 2020-07-01: 10 mL via INTRA_ARTERIAL

## 2020-07-01 MED ORDER — HYDRALAZINE HCL 20 MG/ML IJ SOLN: 10.0000 mg | INTRAMUSCULAR | Status: AC | PRN

## 2020-07-01 MED ORDER — MELATONIN 5 MG PO TABS
5.0000 mg | ORAL_TABLET | Freq: Every evening | ORAL | Status: DC | PRN
  Administered 2020-07-01: 5 mg via ORAL
  Filled 2020-07-01: qty 1

## 2020-07-01 MED ORDER — SODIUM CHLORIDE 0.9 % IV SOLN: 250.0000 mL | INTRAVENOUS | Status: DC | PRN

## 2020-07-01 MED ORDER — HEPARIN (PORCINE) IN NACL 1000-0.9 UT/500ML-% IV SOLN
INTRAVENOUS | Status: DC | PRN
  Administered 2020-07-01 (×2): 500 mL

## 2020-07-01 MED ORDER — CLOPIDOGREL BISULFATE 75 MG PO TABS
75.0000 mg | ORAL_TABLET | Freq: Every day | ORAL | Status: DC
  Administered 2020-07-02: 75 mg via ORAL
  Filled 2020-07-01: qty 1

## 2020-07-01 MED ORDER — HEPARIN SODIUM (PORCINE) 1000 UNIT/ML IJ SOLN
INTRAMUSCULAR | Status: AC
  Filled 2020-07-01: qty 1

## 2020-07-01 MED ORDER — ASPIRIN 81 MG PO CHEW
81.0000 mg | CHEWABLE_TABLET | Freq: Every day | ORAL | Status: DC
  Administered 2020-07-01 – 2020-07-02 (×2): 81 mg via ORAL
  Filled 2020-07-01 (×2): qty 1

## 2020-07-01 MED ORDER — LABETALOL HCL 5 MG/ML IV SOLN
10.0000 mg | INTRAVENOUS | Status: AC | PRN
  Administered 2020-07-01: 10 mg via INTRAVENOUS

## 2020-07-01 MED ORDER — ATORVASTATIN CALCIUM 40 MG PO TABS: 40.0000 mg | ORAL_TABLET | Freq: Every day | ORAL | Status: DC

## 2020-07-01 MED ORDER — CARVEDILOL 3.125 MG PO TABS
3.1250 mg | ORAL_TABLET | Freq: Two times a day (BID) | ORAL | Status: DC
  Administered 2020-07-02: 3.125 mg via ORAL
  Filled 2020-07-01: qty 1

## 2020-07-01 MED ORDER — SODIUM CHLORIDE 0.9% FLUSH
3.0000 mL | Freq: Two times a day (BID) | INTRAVENOUS | Status: DC
  Administered 2020-07-02: 3 mL via INTRAVENOUS

## 2020-07-01 MED ORDER — IOHEXOL 350 MG/ML SOLN
INTRAVENOUS | Status: DC | PRN
  Administered 2020-07-01: 145 mL

## 2020-07-01 MED ORDER — LABETALOL HCL 5 MG/ML IV SOLN
INTRAVENOUS | Status: AC
  Filled 2020-07-01: qty 4

## 2020-07-01 MED ORDER — LIDOCAINE HCL (PF) 1 % IJ SOLN
INTRAMUSCULAR | Status: AC
  Filled 2020-07-01: qty 30

## 2020-07-01 MED ORDER — VERAPAMIL HCL 2.5 MG/ML IV SOLN
INTRAVENOUS | Status: AC
  Filled 2020-07-01: qty 2

## 2020-07-01 MED ORDER — NITROGLYCERIN 1 MG/10 ML FOR IR/CATH LAB
INTRA_ARTERIAL | Status: AC
  Filled 2020-07-01: qty 10

## 2020-07-01 MED ORDER — LIDOCAINE HCL (PF) 1 % IJ SOLN
INTRAMUSCULAR | Status: DC | PRN
  Administered 2020-07-01: 2 mL

## 2020-07-01 MED ORDER — LISINOPRIL 20 MG PO TABS
20.0000 mg | ORAL_TABLET | Freq: Every day | ORAL | Status: DC
  Administered 2020-07-01 – 2020-07-02 (×2): 20 mg via ORAL
  Filled 2020-07-01 (×2): qty 1

## 2020-07-01 SURGICAL SUPPLY — 20 items
BALLN SAPPHIRE 2.5X12 (BALLOONS) ×2
BALLN SAPPHIRE ~~LOC~~ 3.5X12 (BALLOONS) ×1 IMPLANT
BALLN ~~LOC~~ EMERGE MR 3.5X12 (BALLOONS) ×2
BALLOON SAPPHIRE 2.5X12 (BALLOONS) IMPLANT
BALLOON ~~LOC~~ EMERGE MR 3.5X12 (BALLOONS) IMPLANT
CATH 5FR JL3.5 JR4 ANG PIG MP (CATHETERS) ×1 IMPLANT
CATH TELESCOPE 6F GEC (CATHETERS) ×1 IMPLANT
CATH VISTA GUIDE 6FR XB3 (CATHETERS) ×1 IMPLANT
DEVICE RAD COMP TR BAND LRG (VASCULAR PRODUCTS) ×1 IMPLANT
GLIDESHEATH SLEND SS 6F .021 (SHEATH) ×1 IMPLANT
GUIDEWIRE INQWIRE 1.5J.035X260 (WIRE) IMPLANT
INQWIRE 1.5J .035X260CM (WIRE) ×2
KIT ENCORE 26 ADVANTAGE (KITS) ×1 IMPLANT
KIT HEART LEFT (KITS) ×2 IMPLANT
PACK CARDIAC CATHETERIZATION (CUSTOM PROCEDURE TRAY) ×2 IMPLANT
STENT SYNERGY XD 3.50X16 (Permanent Stent) IMPLANT
SYNERGY XD 3.50X16 (Permanent Stent) ×2 IMPLANT
TRANSDUCER W/STOPCOCK (MISCELLANEOUS) ×2 IMPLANT
TUBING CIL FLEX 10 FLL-RA (TUBING) ×2 IMPLANT
WIRE COUGAR XT STRL 190CM (WIRE) ×2 IMPLANT

## 2020-07-01 NOTE — Progress Notes (Signed)
  Echocardiogram 2D Echocardiogram has been performed.  Delcie Roch 07/01/2020, 11:48 AM

## 2020-07-01 NOTE — Consult Note (Addendum)
Cardiology Consultation:   Patient ID: Wyatt Santiago MRN: [id number]; DOB: August 08, 1961  Admit date: 06/30/2020 Date of Consult: 07/01/2020  Primary Care Provider: [facility], [facility] Cardiologist: Dina RichBranch, Jonathan, MD  [facility] Electrophysiologist:  None    Patient Profile:   Wyatt Santiago is a 59 y.o. male with a history of CAD s/p DES to LCX in 03/2015, paroxysmal SVT, hypertension, thrombocytopenia, seizures as a child who is being seen today for the evaluation of chest pain at the request of Dr. Caleb PoppNettey.  History of Present Illness:   Wyatt Santiago is a 59 year old male with the above history who is followed by Dr. [name]. Patient admitted in 03/2015 with unstable angina. Cardiac catheterization at that time showed 90% stenosis of proximal LCX with non-obstructive disease elsewhere. Patient underwent successful PCI with DES to LCX lesion. Myoview in 01/2018 for recurrent chest pain was low risk with medium defect in mid inferoseptal and mid inferior location felt to likely be soft tissue attenuation. No significant ischemia noted. Patient was last seen by Dr. [name] in 02/2018 at which time he reported no further chest pain.  Patient presented to the ED on 06/30/2020 with chest pain and shortness of breath. Patient was in his usual state of health until yesterday when he woke from sleep at 2am with diffuse chest tightness with radiation to both sides of neck. Pain lasted all day. Worse with deep inspiration and bending. 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 No recent chest pain prior to this. He notes associated shortness of breath and weakness with the pain but no diaphoresis, nausea, vomiting. No palpitations, lightheadedness, dizziness, syncope. He has sleep apnea but does not use CPAP. He is able to sleep on a flat surface but has to sleep on his side rather than his back. No orthopnea, PND, or lower extremity edema. No  recent fevers or illnesses. No abnormal bleeding in urine or stools. Patient states symptoms felt very different compared to symptoms prior to PCI. Symptom at that time was mainly profound weakness.   In the ED, patient initially hypertensive and tachycardic. EKG showed sinus tachycardia, rate 114 bpm, with underlying artifact but no ST elevation. High-sensitivity troponin negative. Chest x-ray showed no acute findings. Chest CTA negative for PE but showed severe coronary artery atherosclerosis. WBC 5.1 Hgb 14.7, Plts 117. Na 140, K 4.0, Glucose 108, BUN 16, Cr 1.04. Respiratory panel negative for COVID and influenza.  At the time of this evaluation, patient chest pain free after receiving Morphine in the ED.  He has a 40 year smoking history and currently smokes 2 packs per day. He also endorses alcohol uses and will drink several beers every few days. No recreational drug use. He has a family history of cardiovascular disease - father had a massive stroke in his 4160's, paternal grandmother had a stroke and CHF, and paternal aunt has had multiple TIAs.  Of note, patient would preferably like to leave the hospital tonight. He has a dentist appointment tomorrow where he is scheduled to have 6 bottom teeth extracted (under local).   Past Medical History:  Diagnosis Date  . Arthritis    "hands" (04/19/2015)  . Coronary artery disease    a. cath 04/19/15 1.  Mid LAD to Dist LAD lesion, 40% stenosed, S/p PCI and DES to prox LCx with LV EF of 45%.  . Hypertension   . Seizures (HCC)    "when  I was a kid"  . Sleep apnea    "they say I've got it but I don't wear mask" (04/19/2015)  . SVT (supraventricular tachycardia) (HCC)   . Thrombocytopenia (HCC)     Past Surgical History:  Procedure Laterality Date  . CARDIAC CATHETERIZATION N/A 04/19/2015   Procedure: Left Heart Cath and Coronary Angiography;  Surgeon: Lyn Records, MD;  Location: [facility] INVASIVE CV LAB;  Service: Cardiovascular;  Laterality: N/A;   . CARDIAC CATHETERIZATION N/A 04/19/2015   Procedure: Coronary Stent Intervention;  Surgeon: Lyn Records, MD;  Location: [facility] INVASIVE CV LAB;  Service: Cardiovascular;  Laterality: N/A;  . CORONARY ANGIOPLASTY WITH STENT PLACEMENT  04/19/2015   "1 stent"  . EYE SURGERY Right    "lasered for macular degeneration"     Home Medications:  Prior to Admission medications   Medication Sig Start Date End Date Taking? Authorizing Provider  aspirin 81 MG chewable tablet Chew 1 tablet (81 mg total) by mouth daily. 04/20/15  Yes Bhagat, Bhavinkumar, PA  atorvastatin (LIPITOR) 40 MG tablet TAKE 1 TABLET BY MOUTH EVERY DAY AT 6PM 06/20/17  Yes Branch, Dorothe Pea, MD  atorvastatin (LIPITOR) 40 MG tablet TAKE 1 TABLET BY MOUTH EVERY DAY AT 6PM 07/22/18  Yes Branch, Dorothe Pea, MD  gabapentin (NEURONTIN) 300 MG capsule Take 300 mg by mouth daily. 03/09/20  Yes [provider]  lisinopril (ZESTRIL) 20 MG tablet TAKE 1 TABLET BY MOUTH EVERY DAY 07/08/19  Yes Branch, Dorothe Pea, MD  nitroGLYCERIN (NITROSTAT) 0.4 MG SL tablet Place 1 tablet (0.4 mg total) under the tongue every 5 (five) minutes as needed for chest pain (up to 3 doses. If no relief after 3rd dose, proceed to the ED for an evaluation). 01/23/18  Yes Antoine Poche, MD    Inpatient Medications: Scheduled Meds: . aspirin  81 mg Oral Daily  . atorvastatin  40 mg Oral q1800  . enoxaparin (LOVENOX) injection  40 mg Subcutaneous Q24H  . gabapentin  300 mg Oral Daily  . insulin aspart  0-9 Units Subcutaneous Q4H  . lisinopril  20 mg Oral Daily  . nicotine  21 mg Transdermal Daily   Continuous Infusions:  PRN Meds: acetaminophen, ondansetron (ZOFRAN) IV  Allergies:   No Known Allergies  Social History:   Social History   Socioeconomic History  . Marital status: Married    Spouse name: Not on file  . Number of children: Not on file  . Years of education: Not on file  . Highest education level: Not on file  Occupational History   . Occupation: Oncologist  Tobacco Use  . Smoking status: Current Every Day Smoker    Packs/day: 2.00    Years: 40.00    Pack years: 80.00    Types: Cigarettes    Start date: 04/28/1972  . Smokeless tobacco: Never Used  Substance and Sexual Activity  . Alcohol use: Yes    Alcohol/week: 63.0 standard drinks    Types: 63 Cans of beer per week    Comment: 04/19/2015 Drinks about a 8-9 beers/day.  . Drug use: No  . Sexual activity: Not Currently  Other Topics Concern  . Not on file  Social History Narrative   Lives in [place], [place]   Social Determinants of Health   Financial Resource Strain:   . Difficulty of Paying Living Expenses: Not on file  Food Insecurity:   . Worried About Programme researcher, broadcasting/film/video in the Last Year: Not on file  .  Ran Out of Food in the Last Year: Not on file  Transportation Needs:   . Lack of Transportation (Medical): Not on file  . Lack of Transportation (Non-Medical): Not on file  Physical Activity:   . Days of Exercise per Week: Not on file  . Minutes of Exercise per Session: Not on file  Stress:   . Feeling of Stress : Not on file  Social Connections:   . Frequency of Communication with Friends and Family: Not on file  . Frequency of Social Gatherings with Friends and Family: Not on file  . Attends Religious Services: Not on file  . Active Member of Clubs or Organizations: Not on file  . Attends Club or Organization Meetings: Not on file  . Marital Status: Not on file  Intimate Partner Violence:   . Fear of Current or Ex-Partner: Not on file  . Emotionally Abused: Not on file  . Physically Abused: Not on file  . Sexually Abused: Not on file    Family History:   Family History  Problem Relation Age of Onset  . Hypertension Father      ROS:  Please see the history of present illness.  All other ROS reviewed and negative.     Physical Exam/Data:   Vitals:   07/01/20 1415 07/01/20 1418 07/01/20 1430 07/01/20 1445  BP: 134/83 134/83 137/89  140/90  Pulse: 82 83 80 92  Resp: 18 18 17 17  Temp:      TempSrc:      SpO2: 99% 97% 97% 97%   No intake or output data in the 24 hours ending 07/01/20 1510 Last 3 Weights 03/11/2018 01/23/2018 01/13/2018  Weight (lbs) 203 lb 6.4 oz 199 lb 205 lb  Weight (kg) 92.262 kg 90.266 kg 92.987 kg     There is no height or weight on file to calculate BMI.  General: 59 y.o. male resting comfortably in no acute distress.  HEENT: Normocephalic and atraumatic. Sclera clear.  Neck: Supple. No carotid bruits. No JVD. Heart: RRR. Distinct S1 and S2. No murmurs, gallops, or rubs. Radial pulses 2+ and equal bilaterally. Lungs: No increased work of breathing. Clear to ausculation bilaterally. No wheezes, rhonchi, or rales.  Abdomen: Soft, non-distended, and non-tender to palpation. Bowel sounds present in all 4 quadrants.  Extremities: No lower extremity edema.    Skin: Warm and dry. Neuro: Alert and oriented x3. No focal deficits. Psych: Normal affect. Responds appropriately.   EKG:  The EKG was personally reviewed and demonstrates:  Sinus tachycardia, rate 114 bpm, with PVCs and underlying artifact but possible mild ST depression in V6. Left axis deviation.  Telemetry:  Telemetry was personally reviewed and demonstrates:  Sinus rhythm with rates in the 70's to low 100's. Occasional PVCs.  Relevant CV Studies:  Left Cardiac Catheterization 04/19/2015: 1. Mid LAD to Dist LAD lesion, 40% stenosed. 2. Mid RCA to Dist RCA lesion, 20% stenosed. 3. Ost 1st Mrg lesion, 40% stenosed. 4. Mid Cx-2 lesion, 50% stenosed. 5. Prox Cx lesion, 90% stenosed. There is a 0% residual stenosis post intervention. 6. A drug-eluting stent was placed.    Unstable angina pectoris due to high-grade obstruction in the proximal circumflex with in a tortuous segment.  Successful PCI with Synergy drug-eluting stent implantation and reduction in 90% stenosis to 0% with TIMI grade 3 flow.  Mild residual first obtuse marginal  and mid circumflex obstruction less than 50%. Less than 50% obstruction in the mid LAD.  Overall mildly reduced LV   function with mid anterior wall hypokinesis and estimated ejection fraction 45%.  Recommendations:  Dual antiplatelet therapy (should probably switch to clopidogrel and aspirin after one month). For the time being and should remain on Brilinta and aspirin.  Potentially able to discontinue gap therapy after 6 months using Synergy.  Potentially eligible for discharge in a.m. assuming no complications.  Ethanol abuse needs to be addressed with the patient and consideration of a plan for cessation instituted. _______________  Myoview 01/30/2018:  Blood pressure demonstrated a hypertensive response to exercise.  Nonspecific ST changes with stress which resolved in recovery (leads II and III).  Defect 1: There is a medium defect of mild severity present in the mid inferoseptal and mid inferior location. This appears to be due to soft tissue attenuation. There were no significant ischemic territories.  This is a low risk study.  Nuclear stress EF: 58%. _______________  Laboratory Data:  High Sensitivity Troponin:   Recent Labs  Lab 06/30/20 1950 06/30/20 2215  TROPONINIHS 2 3     Chemistry Recent Labs  Lab 06/30/20 1950  NA 140  K 4.0  CL 106  CO2 25  GLUCOSE 108*  BUN 16  CREATININE 1.04  CALCIUM 9.3  GFRNONAA >60  ANIONGAP 9    No results for input(s): PROT, ALBUMIN, AST, ALT, ALKPHOS, BILITOT in the last 168 hours. Hematology Recent Labs  Lab 06/30/20 1950  WBC 5.1  RBC 4.97  HGB 14.7  HCT 45.3  MCV 91.1  MCH 29.6  MCHC 32.5  RDW 14.2  PLT 117*   BNPNo results for input(s): BNP, PROBNP in the last 168 hours.  DDimer No results for input(s): DDIMER in the last 168 hours.   Radiology/Studies:  DG Chest 2 View  Result Date: 06/30/2020 CLINICAL DATA:  Chest pain EXAM: CHEST - 2 VIEW COMPARISON:  03/09/2020 FINDINGS: Normal heart size.  No pleural effusion or edema identified. No airspace densities identified. Spondylosis identified within the thoracic spine. IMPRESSION: No acute cardiopulmonary abnormalities. Electronically Signed   By: Signa Kell M.D.   On: 06/30/2020 20:00   CT Angio Chest PE W and/or Wo Contrast  Result Date: 06/30/2020 CLINICAL DATA:  Sharp central chest pain, shortness of breath, pain radiating to neck EXAM: CT ANGIOGRAPHY CHEST WITH CONTRAST TECHNIQUE: Multidetector CT imaging of the chest was performed using the standard protocol during bolus administration of intravenous contrast. Multiplanar CT image reconstructions and MIPs were obtained to evaluate the vascular anatomy. CONTRAST:  29mL OMNIPAQUE IOHEXOL 350 MG/ML SOLN COMPARISON:  06/30/2020 FINDINGS: Cardiovascular: This is a technically adequate evaluation of the pulmonary vasculature. No filling defects or pulmonary emboli. The heart is enlarged without pericardial effusion. Extensive atherosclerosis throughout the coronary vasculature. Normal caliber of the thoracic aorta. Mild atherosclerosis of the aortic arch. Mediastinum/Nodes: No enlarged mediastinal, hilar, or axillary lymph nodes. Thyroid gland, trachea, and esophagus demonstrate no significant findings. Lungs/Pleura: Upper lobe predominant emphysema. No acute airspace disease, effusion, or pneumothorax. Central airway is patent. Upper Abdomen: No acute abnormality. Musculoskeletal: No acute or destructive bony lesions. Review of the MIP images confirms the above findings. IMPRESSION: 1. No evidence of pulmonary embolus. 2. No acute intrathoracic process. 3. Severe coronary artery atherosclerosis. 4. Aortic Atherosclerosis (ICD10-I70.0) and Emphysema (ICD10-J43.9). Electronically Signed   By: Sharlet Salina M.D.   On: 06/30/2020 23:09     Assessment and Plan:   Chest Pain History of CAD - Patient presented with chest pain that woke him up from sleep. Worse with deep inspiration,  bending over,  and panting cars. History of DES to LCX in 2015 and low risk Myoview in 2019. - EKG showed sinus tachycardia with a lot of underlying artifact but possible mild ST depression in V6. - High-sensitivity troponin negative x2. - Chest CTA negative for PE. - Echo pending. - Currently chest pain free. - Continue aspirin and high-intensity statin. Will add low dose beta-blocker. - Will check fasting lipid panel if here tomorrow morning. - He has some typical and atypical symptoms. However, he has known CAD and ongoing significant tobacco abuse. Discussed with MD - will plan for cardiac catheterization today.  Hypertension - Patient initially hypertensive with systolic BP as high as 170's. Improved but still mildly elevated. - Continue Lisinopril 20mg  daily. - Will add Coreg 3.125mg  twice daily for additional BP and heart rate control.  TIMI Risk Score for Unstable Angina or Non-ST Elevation MI:   The patient's TIMI risk score is 4, which indicates a 20% risk of all cause mortality, new or recurrent myocardial infarction or need for urgent revascularization in the next 14 days.   For questions or updates, please contact [facility] Please consult [email or web address] for contact info under    Signed, , PA-C  07/01/2020 3:10 PM   History and all data above reviewed.  Patient examined.  I agree with the findings as above.  The patient presents with chest discomfort.  The past cardiac history is as above.  He is very stoic.  The chest discomfort woke him from his sleep.  It was 5 out 10.  He has very hard time qualifying this.  It was heavy but not sharp.  It took his breath away.  He said it felt like he just could not breathe.  He does not think he had this before.  There was no jaw or arm discomfort.  There was no nausea vomiting or diaphoresis.  He did not have any palpitations.  He smoked a cigarette and drank some coffee.  He went on to work but he was so uncomfortable he came home  driving from [place] to [place].  His wife finally brought him to the emergency room.  He had some morphine and aspirin and his pain went away.  He did not have any acute EKG changes and his initial troponin was negative.  He is not sure that he has ever had this kind of discomfort before.   Patient exam reveals COR: Regular rate and rhythm, no murmur,  Lungs: Decreased breath sounds without wheezing or crackles,  Abd: Positive bowel sounds normal in frequency in pitch, Ext no edema.   Carotid:  Right bruit.  All available labs, radiology testing, previous records reviewed. Agree with documented assessment and plan. Chest pain:  Unstable angina.  I think the pretest probability of obstructive coronary disease as the etiology of these complaints is very high.  CT was negative for PE or aortic dissection.  He has significant ongoing risk factors.  Cardiac catheterization is indicated. The patient understands that risks included but are not limited to stroke (1 in 1000), death (1 in 1000), kidney failure [usually temporary] (1 in 500), bleeding (1 in 200), allergic reaction [possibly serious] (1 in 200).  The patient understands and agrees to proceed.   Bruit: I will order carotid Doppler.  Tobacco abuse: He will be educated.  Risk reduction: Check his lipids.  He has been compliant with his statin.  [place]  3:38 PM  07/01/2020

## 2020-07-01 NOTE — Progress Notes (Signed)
Rt arm elevated on pillow; rt radial site w/TR band on w/ reported 16cc air level 0.

## 2020-07-01 NOTE — H&P (Signed)
History and Physical    Wyatt FriesSteven R Dolney ZOX:[id number] DOB: December 16, 1960 DOA: 06/30/2020  [facility]: [facility]  Patient coming from: Home  I have personally briefly reviewed patient's old medical [facility]  Chief Complaint: CP  HPI: Wyatt Santiago is a 59 y.o. male with medical history significant of smoking, EtOH abuse, CAD s/p DES to LCx in 2016, EF 45%.  Pt presents to the ED with c/o chest pain.  CP onset and woke him from sleep at 2AM this morning.  Waxing and waning throughout day, but then increased in severity.  Pain currently resolved at this time.  Did note increased pain with exertion.  No fevers, chills, cough, N/V/D, lightheadedness, abd pain, back pain.   ED Course: CTA chest neg for PE or acute finding, does show severe CAD.  Has had COVID vaccine.  Still does EtOH, still smokes 2ppd though this is down from 4ppd previously apparently.   Review of Systems: As per HPI, otherwise all review of systems negative.  Past Medical History:  Diagnosis Date  . Arthritis    "hands" (04/19/2015)  . Coronary artery disease    a. cath 04/19/15 1.  Mid LAD to Dist LAD lesion, 40% stenosed, S/p PCI and DES to prox LCx with LV EF of 45%.  . Hypertension   . Seizures (HCC)    "when I was a kid"  . Sleep apnea    "they say I've got it but I don't wear mask" (04/19/2015)  . SVT (supraventricular tachycardia) (HCC)   . Thrombocytopenia (HCC)     Past Surgical History:  Procedure Laterality Date  . CARDIAC CATHETERIZATION N/A 04/19/2015   Procedure: Left Heart Cath and Coronary Angiography;  Surgeon: Lyn RecordsHenry W Smith, MD;  [facility];  Service: Cardiovascular;  Laterality: N/A;  . CARDIAC CATHETERIZATION N/A 04/19/2015   Procedure: Coronary Stent Intervention;  Surgeon: Lyn RecordsHenry W Smith, MD;  [facility];  Service: Cardiovascular;  Laterality: N/A;  . CORONARY ANGIOPLASTY WITH STENT PLACEMENT  04/19/2015   "1 stent"   . EYE SURGERY Right    "lasered for macular degeneration"     reports that he has been smoking cigarettes. He started smoking about 48 years ago. He has a 80.00 pack-year smoking history. He has never used smokeless tobacco. He reports current alcohol use of about 63.0 standard drinks of alcohol per week. He reports that he does not use drugs.  No Known Allergies  Family History  Problem Relation Age of Onset  . Hypertension Father      Prior to Admission medications   Medication Sig Start Date End Date Taking? Authorizing Provider  aspirin 81 MG chewable tablet Chew 1 tablet (81 mg total) by mouth daily. 04/20/15  Yes Bhagat, Bhavinkumar, PA  atorvastatin (LIPITOR) 40 MG tablet TAKE 1 TABLET BY MOUTH EVERY DAY AT 6PM 06/20/17  Yes Branch, Dorothe PeaJonathan F, MD  atorvastatin (LIPITOR) 40 MG tablet TAKE 1 TABLET BY MOUTH EVERY DAY AT 6PM 07/22/18  Yes Branch, Dorothe PeaJonathan F, MD  gabapentin (NEURONTIN) 300 MG capsule Take 300 mg by mouth daily. 03/09/20  Yes [provider]  lisinopril (ZESTRIL) 20 MG tablet TAKE 1 TABLET BY MOUTH EVERY DAY 07/08/19  Yes Branch, Dorothe PeaJonathan F, MD  nitroGLYCERIN (NITROSTAT) 0.4 MG SL tablet Place 1 tablet (0.4 mg total) under the tongue every 5 (five) minutes as needed for chest pain (up to 3 doses. If no relief after 3rd dose, proceed  to the ED for an evaluation). 01/23/18  Yes Branch, Dorothe Pea, MD  doxycycline (VIBRA-TABS) 100 MG tablet Take 1 tablet (100 mg total) by mouth 2 (two) times daily. Patient not taking: Reported on 06/30/2020 11/08/18   Felecia Shelling, DPM  gentamicin cream (GARAMYCIN) 0.1 % Apply 1 application topically 2 (two) times daily. Patient not taking: Reported on 06/30/2020 11/08/18   Felecia Shelling, DPM  metoprolol tartrate (LOPRESSOR) 25 MG tablet Take 1.5 tablets (37.5 mg total) by mouth 2 (two) times daily. Patient not taking: Reported on 06/30/2020 01/23/18   Antoine Poche, MD    Physical Exam: Vitals:   06/30/20 2045 06/30/20 2100  06/30/20 2200 06/30/20 2345  BP: (!) 135/91 116/71 126/75 129/84  Pulse: (!) 104 95 94 99  Resp: (!) 22 15 17 12   Temp:      TempSrc:      SpO2: 99% 99% 96% 96%    Constitutional: NAD, calm, comfortable Eyes: PERRL, lids and conjunctivae normal ENMT: Mucous membranes are moist. Posterior pharynx clear of any exudate or lesions.Normal dentition.  Neck: normal, supple, no masses, no thyromegaly Respiratory: clear to auscultation bilaterally, no wheezing, no crackles. Normal respiratory effort. No accessory muscle use.  Cardiovascular: Regular rate and rhythm, no murmurs / rubs / gallops. No extremity edema. 2+ pedal pulses. No carotid bruits.  Abdomen: no tenderness, no masses palpated. No hepatosplenomegaly. Bowel sounds positive.  Musculoskeletal: no clubbing / cyanosis. No joint deformity upper and lower extremities. Good ROM, no contractures. Normal muscle tone.  Skin: no rashes, lesions, ulcers. No induration Neurologic: CN 2-12 grossly intact. Sensation intact, DTR normal. Strength 5/5 in all 4.  Psychiatric: Normal judgment and insight. Alert and oriented x 3. Normal mood.    Labs on Admission: I have personally reviewed following labs and imaging studies  CBC: Recent Labs  Lab 06/30/20 1950  WBC 5.1  HGB 14.7  HCT 45.3  MCV 91.1  PLT 117*   Basic Metabolic Panel: Recent Labs  Lab 06/30/20 1950  NA 140  K 4.0  CL 106  CO2 25  GLUCOSE 108*  BUN 16  CREATININE 1.04  CALCIUM 9.3   GFR: CrCl cannot be calculated (Unknown ideal weight.). Liver Function Tests: No results for input(s): AST, ALT, ALKPHOS, BILITOT, PROT, ALBUMIN in the last 168 hours. No results for input(s): LIPASE, AMYLASE in the last 168 hours. No results for input(s): AMMONIA in the last 168 hours. Coagulation Profile: No results for input(s): INR, PROTIME in the last 168 hours. Cardiac Enzymes: No results for input(s): CKTOTAL, CKMB, CKMBINDEX, TROPONINI in the last 168 hours. BNP (last 3  results) No results for input(s): PROBNP in the last 8760 hours. HbA1C: No results for input(s): HGBA1C in the last 72 hours. CBG: No results for input(s): GLUCAP in the last 168 hours. Lipid Profile: No results for input(s): CHOL, HDL, LDLCALC, TRIG, CHOLHDL, LDLDIRECT in the last 72 hours. Thyroid Function Tests: No results for input(s): TSH, T4TOTAL, FREET4, T3FREE, THYROIDAB in the last 72 hours. Anemia Panel: No results for input(s): VITAMINB12, FOLATE, FERRITIN, TIBC, IRON, RETICCTPCT in the last 72 hours. Urine analysis:    Component Value Date/Time   COLORURINE STRAW (A) 01/13/2018 0337   APPEARANCEUR CLEAR 01/13/2018 0337   LABSPEC 1.004 (L) 01/13/2018 0337   PHURINE 5.0 01/13/2018 0337   GLUCOSEU NEGATIVE 01/13/2018 0337   HGBUR SMALL (A) 01/13/2018 0337   BILIRUBINUR NEGATIVE 01/13/2018 0337   KETONESUR NEGATIVE 01/13/2018 0337   PROTEINUR NEGATIVE 01/13/2018  5397   NITRITE NEGATIVE 01/13/2018 0337   LEUKOCYTESUR NEGATIVE 01/13/2018 6734    Radiological Exams on Admission: DG Chest 2 View  Result Date: 06/30/2020 CLINICAL DATA:  Chest pain EXAM: CHEST - 2 VIEW COMPARISON:  03/09/2020 FINDINGS: Normal heart size. No pleural effusion or edema identified. No airspace densities identified. Spondylosis identified within the thoracic spine. IMPRESSION: No acute cardiopulmonary abnormalities. Electronically Signed   By: Signa Kell M.D.   On: 06/30/2020 20:00   CT Angio Chest PE W and/or Wo Contrast  Result Date: 06/30/2020 CLINICAL DATA:  Sharp central chest pain, shortness of breath, pain radiating to neck EXAM: CT ANGIOGRAPHY CHEST WITH CONTRAST TECHNIQUE: Multidetector CT imaging of the chest was performed using the standard protocol during bolus administration of intravenous contrast. Multiplanar CT image reconstructions and MIPs were obtained to evaluate the vascular anatomy. CONTRAST:  55mL OMNIPAQUE IOHEXOL 350 MG/ML SOLN COMPARISON:  06/30/2020 FINDINGS:  Cardiovascular: This is a technically adequate evaluation of the pulmonary vasculature. No filling defects or pulmonary emboli. The heart is enlarged without pericardial effusion. Extensive atherosclerosis throughout the coronary vasculature. Normal caliber of the thoracic aorta. Mild atherosclerosis of the aortic arch. Mediastinum/Nodes: No enlarged mediastinal, hilar, or axillary lymph nodes. Thyroid gland, trachea, and esophagus demonstrate no significant findings. Lungs/Pleura: Upper lobe predominant emphysema. No acute airspace disease, effusion, or pneumothorax. Central airway is patent. Upper Abdomen: No acute abnormality. Musculoskeletal: No acute or destructive bony lesions. Review of the MIP images confirms the above findings. IMPRESSION: 1. No evidence of pulmonary embolus. 2. No acute intrathoracic process. 3. Severe coronary artery atherosclerosis. 4. Aortic Atherosclerosis (ICD10-I70.0) and Emphysema (ICD10-J43.9). Electronically Signed   By: Sharlet Salina M.D.   On: 06/30/2020 23:09    EKG: Independently reviewed.  Assessment/Plan Principal Problem:   Chest pain, rule out acute myocardial infarction Active Problems:   HTN (hypertension)   Alcohol abuse   Cigarette smoker   CAD (coronary artery disease), native coronary artery    1. CP - with h/o CAD 1. CP obs pathway 2. Tele monitor 3. NPO 4. Cards eval in AM 5. Cont ASA + lipitor 2. EtOH abuse - 1. CIWA 3. Smoking - 1. Strongly encouraged pt to quit 4. HTN - 1. Cont ACEi  DVT prophylaxis: Lovenox Code Status: Full Family Communication: Wife at bedside Disposition Plan: Home after CP r/o Consults called: Message sent to P. Trent for cards eval in AM Admission status: Place in [place]   Clevon Khader, [place] M. DO [facility]  How to contact the [facility] Attending or Consulting provider 7A - 7P or covering provider during after hours 7P -7A, for this patient?  1. Check the care team in [facility] and look for a)  attending/consulting [facility] provider listed and b) the [facility] team listed 2. Log into [email or web address]  Amion Physician Scheduling and messaging for groups and whole hospitals  On call and physician scheduling software for group practices, residents, hospitalists and other medical providers for call, clinic, rotation and shift schedules. OnCall Enterprise is a hospital-wide system for scheduling doctors and paging doctors on call. EasyPlot is for scientific plotting and data analysis.  [email or web address]  and use [place]'s universal password to access. If you do not have the password, please contact the hospital operator.  3. Locate the [facility] provider you are looking for under [facility] and page to a number that you can be directly reached. 4. If you still have difficulty reaching the provider, please page the [facility] (Director on Call) for the  Hospitalists listed on amion for assistance.  07/01/2020, 12:08 AM

## 2020-07-01 NOTE — Interval H&P Note (Signed)
History and Physical Interval Note:  07/01/2020 4:12 PM  Wyatt Santiago  has presented today for surgery, with the diagnosis of unstable angina.  The various methods of treatment have been discussed with the patient and family. After consideration of risks, benefits and other options for treatment, the patient has consented to  Procedure(s): LEFT HEART CATH AND CORONARY ANGIOGRAPHY (N/A) as a surgical intervention.  The patient's history has been reviewed, patient examined, no change in status, stable for surgery.  I have reviewed the patient's chart and labs.  Questions were answered to the patient's satisfaction.    Cath Lab Visit (complete for each Cath Lab visit)  Clinical Evaluation Leading to the Procedure:   ACS: No.  Non-ACS:    Anginal Classification: CCS III  Anti-ischemic medical therapy: No Therapy  Non-Invasive Test Results: No non-invasive testing performed  Prior CABG: No previous CABG        Verne Carrow

## 2020-07-01 NOTE — Progress Notes (Signed)
[facility]  PROGRESS NOTE  Wyatt Santiago IRS:[id number] DOB: 1961/05/12 DOA: 06/30/2020 [facility]: [facility] Admit date - 06/30/2020   Admitting Physician Hillary Bow, DO  Outpatient Primary MD for the patient is [facility], [facility]  LOS - 0 Brief Narrative  [name] is a 59 year old male with medical history significant for CAD stents in 2016, smoking, alcohol abuse, HTN who presented on 11/10 with complaints of chest pain that started in the morning woke him up from his sleep, waxing and waning throughout the day.  Patient was admitted under observation with working diagnosis of acute coronary syndrome.  CAD history [echo which is a chronic chest pain.  Patient underwent CTA chest which was negative for PE or any other acute pathology.  His troponin trend was negative x2.  His EKG showed no acute ischemic changes.  .  Cardiology was consulted  Subjective  Today no current chest pain A & P   Chest pain with some typical features in a patient with CAD, currently stable.  Encourage extra monitoring has been unremarkable.  Patient currently not endorsing chest pain.  He does report dyspnea at baseline, troponin trend unremarkable, EKG shows no acute ischemic changes. -Follow-up TTE -follow-up cardiology recommendations---> plan for left heart cath given signficant risk with prior history and typical feature of ACS -EKG as needed chest pain-as needed nitro  HTN, PAD, SBP in 150s-170s -continue home ACE inhibitor --add coreg per cardiology  History of alcohol abuse.  No current signs or symptoms of withdrawal -continue to monitor on CIWA protocol   Family Communication  : Wife updated at bedside  Code Status : Full code  Disposition Plan  :  Patient is from home. Anticipated d/c date: 1-2 days. Barriers to d/c or necessity for inpatient status: Observation, undergoing LHC on 11/11/ per cardiology Consults  : Cardiology  Procedures  : TTE,  11/11  DVT Prophylaxis  :  Lovenox   MDM: The below labs and imaging reports were reviewed and summarized above.  Medication management as above.  Lab Results  Component Value Date   PLT 117 (L) 06/30/2020    Diet :  Diet Order            Diet NPO time specified Except for: Sips with Meds  Diet effective now                  Inpatient Medications Scheduled Meds: . aspirin  81 mg Oral Daily  . atorvastatin  40 mg Oral q1800  . enoxaparin (LOVENOX) injection  40 mg Subcutaneous Q24H  . gabapentin  300 mg Oral Daily  . insulin aspart  0-9 Units Subcutaneous Q4H  . lisinopril  20 mg Oral Daily  . nicotine  21 mg Transdermal Daily   Continuous Infusions: PRN Meds:.acetaminophen, ondansetron (ZOFRAN) IV  Antibiotics  :   Anti-infectives (From admission, onward)   None       Objective   Vitals:   07/01/20 1230 07/01/20 1245 07/01/20 1300 07/01/20 1315  BP: 135/88 (!) 173/78 136/80 132/81  Pulse: 74 80 79 76  Resp: 13 15 12 13   Temp:      TempSrc:      SpO2: 98% 97% 97% 98%    SpO2: 98 %  Wt Readings from Last 3 Encounters:  03/11/18 92.3 kg  01/23/18 90.3 kg  01/13/18 93 kg    No intake or output data in the 24 hours ending 07/01/20 1332  Physical Exam:  Awake Alert, Oriented X 3, Normal affect No new F.N deficits,  [place].AT, Normal respiratory effort on room air, CTAB RRR,No Gallops,Rubs or new Murmurs,  +ve B.Sounds, Abd Soft, No tenderness, No rebound, guarding or rigidity. No Cyanosis, No new Rash or bruise     I have personally reviewed the following:   Data Reviewed:  CBC Recent Labs  Lab 06/30/20 1950  WBC 5.1  HGB 14.7  HCT 45.3  PLT 117*  MCV 91.1  MCH 29.6  MCHC 32.5  RDW 14.2    Chemistries  Recent Labs  Lab 06/30/20 1950  NA 140  K 4.0  CL 106  CO2 25  GLUCOSE 108*  BUN 16  CREATININE 1.04  CALCIUM 9.3    ------------------------------------------------------------------------------------------------------------------ No results for input(s): CHOL, HDL, LDLCALC, TRIG, CHOLHDL, LDLDIRECT in the last 72 hours.  Lab Results  Component Value Date   HGBA1C 5.0 06/30/2020   ------------------------------------------------------------------------------------------------------------------ No results for input(s): TSH, T4TOTAL, T3FREE, THYROIDAB in the last 72 hours.  Invalid input(s): FREET3 ------------------------------------------------------------------------------------------------------------------ No results for input(s): VITAMINB12, FOLATE, FERRITIN, TIBC, IRON, RETICCTPCT in the last 72 hours.  Coagulation profile No results for input(s): INR, PROTIME in the last 168 hours.  No results for input(s): DDIMER in the last 72 hours.  Cardiac Enzymes No results for input(s): CKMB, TROPONINI, MYOGLOBIN in the last 168 hours.  Invalid input(s): CK ------------------------------------------------------------------------------------------------------------------    Component Value Date/Time   BNP 150.4 (H) 04/19/2015 8119    Micro Results Recent Results (from the past 240 hour(s))  Respiratory Panel by RT PCR (Flu A&B, Covid) - Nasopharyngeal Swab     Status: None   Collection Time: 06/30/20  8:53 PM   Specimen: Nasopharyngeal Swab  Result Value Ref Range Status   SARS Coronavirus 2 by RT PCR NEGATIVE NEGATIVE Final    Comment: (NOTE) SARS-CoV-2 target nucleic acids are NOT DETECTED.  The SARS-CoV-2 RNA is generally detectable in upper respiratoy specimens during the acute phase of infection. The lowest concentration of SARS-CoV-2 viral copies this assay can detect is 131 copies/mL. A negative result does not preclude SARS-Cov-2 infection and should not be used as the sole basis for treatment or other patient management decisions. A negative result may occur with  improper  specimen collection/handling, submission of specimen other than nasopharyngeal swab, presence of viral mutation(s) within the areas targeted by this assay, and inadequate number of viral copies (<131 copies/mL). A negative result must be combined with clinical observations, patient history, and epidemiological information. The expected result is Negative.  Fact Sheet for Patients:  https://www.moore.com/  Fact Sheet for Healthcare Providers:  https://www.young.biz/  This test is no t yet approved or cleared by the [place] FDA and  has been authorized for detection and/or diagnosis of SARS-CoV-2 by FDA under an Emergency Use Authorization (EUA). This EUA will remain  in effect (meaning this test can be used) for the duration of the COVID-19 declaration under Section 564(b)(1) of the Act, 21 U.S.C. section 360bbb-3(b)(1), unless the authorization is terminated or revoked sooner.     Influenza A by PCR NEGATIVE NEGATIVE Final   Influenza B by PCR NEGATIVE NEGATIVE Final    Comment: (NOTE) The Xpert Xpress SARS-CoV-2/FLU/RSV assay is intended as an aid in  the diagnosis of influenza from Nasopharyngeal swab specimens and  should not be used as a sole basis for treatment. Nasal washings and  aspirates are unacceptable for Xpert Xpress SARS-CoV-2/FLU/RSV  testing.  Fact Sheet for Patients: https://www.moore.com/  Fact Sheet for  Healthcare Providers: https://www.young.biz/  This test is not yet approved or cleared by the [place] and  has been authorized for detection and/or diagnosis of SARS-CoV-2 by  FDA under an Emergency Use Authorization (EUA). This EUA will remain  in effect (meaning this test can be used) for the duration of the  Covid-19 declaration under Section 564(b)(1) of the Act, 21  U.S.C. section 360bbb-3(b)(1), unless the authorization is  terminated or revoked. Performed at  [facility], [address], [place], [place] [address]     Radiology Reports DG Chest 2 View  Result Date: 06/30/2020 CLINICAL DATA:  Chest pain EXAM: CHEST - 2 VIEW COMPARISON:  03/09/2020 FINDINGS: Normal heart size. No pleural effusion or edema identified. No airspace densities identified. Spondylosis identified within the thoracic spine. IMPRESSION: No acute cardiopulmonary abnormalities. Electronically Signed   By: Signa Kell M.D.   On: 06/30/2020 20:00   CT Angio Chest PE W and/or Wo Contrast  Result Date: 06/30/2020 CLINICAL DATA:  Sharp central chest pain, shortness of breath, pain radiating to neck EXAM: CT ANGIOGRAPHY CHEST WITH CONTRAST TECHNIQUE: Multidetector CT imaging of the chest was performed using the standard protocol during bolus administration of intravenous contrast. Multiplanar CT image reconstructions and MIPs were obtained to evaluate the vascular anatomy. CONTRAST:  12mL OMNIPAQUE IOHEXOL 350 MG/ML SOLN COMPARISON:  06/30/2020 FINDINGS: Cardiovascular: This is a technically adequate evaluation of the pulmonary vasculature. No filling defects or pulmonary emboli. The heart is enlarged without pericardial effusion. Extensive atherosclerosis throughout the coronary vasculature. Normal caliber of the thoracic aorta. Mild atherosclerosis of the aortic arch. Mediastinum/Nodes: No enlarged mediastinal, hilar, or axillary lymph nodes. Thyroid gland, trachea, and esophagus demonstrate no significant findings. Lungs/Pleura: Upper lobe predominant emphysema. No acute airspace disease, effusion, or pneumothorax. Central airway is patent. Upper Abdomen: No acute abnormality. Musculoskeletal: No acute or destructive bony lesions. Review of the MIP images confirms the above findings. IMPRESSION: 1. No evidence of pulmonary embolus. 2. No acute intrathoracic process. 3. Severe coronary artery atherosclerosis. 4. Aortic Atherosclerosis (ICD10-I70.0) and Emphysema (ICD10-J43.9).  Electronically Signed   By: Sharlet Salina M.D.   On: 06/30/2020 23:09     Time Spent in minutes  30     Laverna Peace M.D on 07/01/2020 at 1:32 PM  To page go to [email or web address] - password [facility]

## 2020-07-01 NOTE — H&P (View-Only) (Signed)
Cardiology Consultation:   Patient ID: Wyatt Santiago MRN: [id number]; DOB: August 08, 1961  Admit date: 06/30/2020 Date of Consult: 07/01/2020  Primary Care Provider: Nathen Santiago, [facility] Cardiologist: Wyatt Santiago, Jonathan, Santiago  [facility] Electrophysiologist:  None    Patient Profile:   Wyatt Santiago is a 59 y.o. male with a history of CAD s/p DES to LCX in 03/2015, paroxysmal SVT, hypertension, thrombocytopenia, seizures as a child who is being seen today for the evaluation of chest pain at the request of Wyatt Santiago.  History of Present Illness:   Wyatt Santiago is a 59 year old male with the above history who is followed by Wyatt Santiago. Patient admitted in 03/2015 with unstable angina. Cardiac catheterization at that time showed 90% stenosis of proximal LCX with non-obstructive disease elsewhere. Patient underwent successful PCI with DES to LCX lesion. Myoview in 01/2018 for recurrent chest pain was low risk with medium defect in mid inferoseptal and mid inferior location felt to likely be soft tissue attenuation. No significant ischemia noted. Patient was last seen by Wyatt Santiago in 02/2018 at which time he reported no further chest pain.  Patient presented to the ED on 06/30/2020 with chest pain and shortness of breath. Patient was in his usual state of health until yesterday when he woke from sleep at 2am with diffuse chest tightness with radiation to both sides of neck. Pain lasted all day. Worse with deep inspiration and bending. 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 No recent chest pain prior to this. He notes associated shortness of breath and weakness with the pain but no diaphoresis, nausea, vomiting. No palpitations, lightheadedness, dizziness, syncope. He has sleep apnea but does not use CPAP. He is able to sleep on a flat surface but has to sleep on his side rather than his back. No orthopnea, PND, or lower extremity edema. No  recent fevers or illnesses. No abnormal bleeding in urine or stools. Patient states symptoms felt very different compared to symptoms prior to PCI. Symptom at that time was mainly profound weakness.   In the ED, patient initially hypertensive and tachycardic. EKG showed sinus tachycardia, rate 114 bpm, with underlying artifact but no ST elevation. High-sensitivity troponin negative. Chest x-ray showed no acute findings. Chest CTA negative for PE but showed severe coronary artery atherosclerosis. WBC 5.1 Hgb 14.7, Plts 117. Na 140, K 4.0, Glucose 108, BUN 16, Cr 1.04. Respiratory panel negative for COVID and influenza.  At the time of this evaluation, patient chest pain free after receiving Morphine in the ED.  He has a 40 year smoking history and currently smokes 2 packs per day. He also endorses alcohol uses and will drink several beers every few days. No recreational drug use. He has a family history of cardiovascular disease - father had a massive stroke in his 4160's, paternal grandmother had a stroke and CHF, and paternal aunt has had multiple TIAs.  Of note, patient would preferably like to leave the hospital tonight. He has a dentist appointment tomorrow where he is scheduled to have 6 bottom teeth extracted (under local).   Past Medical History:  Diagnosis Date  . Arthritis    "hands" (04/19/2015)  . Coronary artery disease    a. cath 04/19/15 1.  Mid LAD to Dist LAD lesion, 40% stenosed, S/p PCI and DES to prox LCx with LV EF of 45%.  . Hypertension   . Seizures (HCC)    "when  I was a kid"  . Sleep apnea    "they say I've got it but I don't wear mask" (04/19/2015)  . SVT (supraventricular tachycardia) (HCC)   . Thrombocytopenia (HCC)     Past Surgical History:  Procedure Laterality Date  . CARDIAC CATHETERIZATION N/A 04/19/2015   Procedure: Left Heart Cath and Coronary Angiography;  Surgeon: Wyatt Records, Santiago;  Location: [facility] INVASIVE CV LAB;  Service: Cardiovascular;  Laterality: N/A;   . CARDIAC CATHETERIZATION N/A 04/19/2015   Procedure: Coronary Stent Intervention;  Surgeon: Wyatt Records, Santiago;  Location: [facility] INVASIVE CV LAB;  Service: Cardiovascular;  Laterality: N/A;  . CORONARY ANGIOPLASTY WITH STENT PLACEMENT  04/19/2015   "1 stent"  . EYE SURGERY Right    "lasered for macular degeneration"     Home Medications:  Prior to Admission medications   Medication Sig Start Date End Date Taking? Authorizing Provider  aspirin 81 MG chewable tablet Chew 1 tablet (81 mg total) by mouth daily. 04/20/15  Yes Wyatt Santiago  atorvastatin (LIPITOR) 40 MG tablet TAKE 1 TABLET BY MOUTH EVERY DAY AT 6PM 06/20/17  Yes Wyatt Santiago  atorvastatin (LIPITOR) 40 MG tablet TAKE 1 TABLET BY MOUTH EVERY DAY AT 6PM 07/22/18  Yes Wyatt Santiago  gabapentin (NEURONTIN) 300 MG capsule Take 300 mg by mouth daily. 03/09/20  Yes Provider, Historical, Santiago  lisinopril (ZESTRIL) 20 MG tablet TAKE 1 TABLET BY MOUTH EVERY DAY 07/08/19  Yes Wyatt Santiago  nitroGLYCERIN (NITROSTAT) 0.4 MG SL tablet Place 1 tablet (0.4 mg total) under the tongue every 5 (five) minutes as needed for chest pain (up to 3 doses. If no relief after 3rd dose, proceed to the ED for an evaluation). 01/23/18  Yes Wyatt Santiago    Inpatient Medications: Scheduled Meds: . aspirin  81 mg Oral Daily  . atorvastatin  40 mg Oral q1800  . enoxaparin (LOVENOX) injection  40 mg Subcutaneous Q24H  . gabapentin  300 mg Oral Daily  . insulin aspart  0-9 Units Subcutaneous Q4H  . lisinopril  20 mg Oral Daily  . nicotine  21 mg Transdermal Daily   Continuous Infusions:  PRN Meds: acetaminophen, ondansetron (ZOFRAN) IV  Allergies:   No Known Allergies  Social History:   Social History   Socioeconomic History  . Marital status: Married    Spouse name: Not on file  . Number of children: Not on file  . Years of education: Not on file  . Highest education level: Not on file  Occupational History   . Occupation: Oncologist  Tobacco Use  . Smoking status: Current Every Day Smoker    Packs/day: 2.00    Years: 40.00    Pack years: 80.00    Types: Cigarettes    Start date: 04/28/1972  . Smokeless tobacco: Never Used  Substance and Sexual Activity  . Alcohol use: Yes    Alcohol/week: 63.0 standard drinks    Types: 63 Cans of beer per week    Comment: 04/19/2015 Drinks about a 8-9 beers/day.  . Drug use: No  . Sexual activity: Not Currently  Other Topics Concern  . Not on file  Social History Narrative   Lives in [place], [place]   Social Determinants of Health   Financial Resource Strain:   . Difficulty of Paying Living Expenses: Not on file  Food Insecurity:   . Worried About Programme researcher, broadcasting/film/video in the Last Year: Not on file  .  Ran Out of Food in the Last Year: Not on file  Transportation Needs:   . Lack of Transportation (Medical): Not on file  . Lack of Transportation (Non-Medical): Not on file  Physical Activity:   . Days of Exercise per Week: Not on file  . Minutes of Exercise per Session: Not on file  Stress:   . Feeling of Stress : Not on file  Social Connections:   . Frequency of Communication with Friends and Family: Not on file  . Frequency of Social Gatherings with Friends and Family: Not on file  . Attends Religious Services: Not on file  . Active Member of Clubs or Organizations: Not on file  . Attends Banker Meetings: Not on file  . Marital Status: Not on file  Intimate Partner Violence:   . Fear of Current or Ex-Partner: Not on file  . Emotionally Abused: Not on file  . Physically Abused: Not on file  . Sexually Abused: Not on file    Family History:   Family History  Problem Relation Age of Onset  . Hypertension Father      ROS:  Please see the history of present illness.  All other ROS reviewed and negative.     Physical Exam/Data:   Vitals:   07/01/20 1415 07/01/20 1418 07/01/20 1430 07/01/20 1445  BP: 134/83 134/83 137/89  140/90  Pulse: 82 83 80 92  Resp: Temp:      TempSrc:      SpO2: 99% 97% 97% 97%   No intake or output data in the 24 hours ending 07/01/20 1510 Last 3 Weights 03/11/2018 01/23/2018 01/13/2018  Weight (lbs) 203 lb 6.4 oz 199 lb 205 lb  Weight (kg) 92.262 kg 90.266 kg 92.987 kg     There is no height or weight on file to calculate BMI.  General: 59 y.o. male resting comfortably in no acute distress.  HEENT: Normocephalic and atraumatic. Sclera clear.  Neck: Supple. No carotid bruits. No JVD. Heart: RRR. Distinct S1 and S2. No murmurs, gallops, or rubs. Radial pulses 2+ and equal bilaterally. Lungs: No increased work of breathing. Clear to ausculation bilaterally. No wheezes, rhonchi, or rales.  Abdomen: Soft, non-distended, and non-tender to palpation. Bowel sounds present in all 4 quadrants.  Extremities: No lower extremity edema.    Skin: Warm and dry. Neuro: Alert and oriented x3. No focal deficits. Psych: Normal affect. Responds appropriately.   EKG:  The EKG was personally reviewed and demonstrates:  Sinus tachycardia, rate 114 bpm, with PVCs and underlying artifact but possible mild ST depression in V6. Left axis deviation.  Telemetry:  Telemetry was personally reviewed and demonstrates:  Sinus rhythm with rates in the 70's to low 100's. Occasional PVCs.  Relevant CV Studies:  Left Cardiac Catheterization 04/19/2015: 1. Mid LAD to Dist LAD lesion, 40% stenosed. 2. Mid RCA to Dist RCA lesion, 20% stenosed. 3. Ost 1st Mrg lesion, 40% stenosed. 4. Mid Cx-2 lesion, 50% stenosed. 5. Prox Cx lesion, 90% stenosed. There is a 0% residual stenosis post intervention. 6. A drug-eluting stent was placed.    Unstable angina pectoris due to high-grade obstruction in the proximal circumflex with in a tortuous segment.  Successful PCI with Synergy drug-eluting stent implantation and reduction in 90% stenosis to 0% with TIMI grade 3 flow.  Mild residual first obtuse marginal  and mid circumflex obstruction less than 50%. Less than 50% obstruction in the mid LAD.  Overall mildly reduced LV  function with mid anterior wall hypokinesis and estimated ejection fraction 45%.  Recommendations:  Dual antiplatelet therapy (should probably switch to clopidogrel and aspirin after one month). For the time being and should remain on Brilinta and aspirin.  Potentially able to discontinue gap therapy after 6 months using Synergy.  Potentially eligible for discharge in a.m. assuming no complications.  Ethanol abuse needs to be addressed with the patient and consideration of a plan for cessation instituted. _______________  Myoview 01/30/2018:  Blood pressure demonstrated a hypertensive response to exercise.  Nonspecific ST changes with stress which resolved in recovery (leads II and III).  Defect 1: There is a medium defect of mild severity present in the mid inferoseptal and mid inferior location. This appears to be due to soft tissue attenuation. There were no significant ischemic territories.  This is a low risk study.  Nuclear stress EF: 58%. _______________  Laboratory Data:  High Sensitivity Troponin:   Recent Labs  Lab 06/30/20 1950 06/30/20 2215  TROPONINIHS 2 3     Chemistry Recent Labs  Lab 06/30/20 1950  NA 140  K 4.0  CL 106  CO2 25  GLUCOSE 108*  BUN 16  CREATININE 1.04  CALCIUM 9.3  GFRNONAA >60  ANIONGAP 9    No results for input(s): PROT, ALBUMIN, AST, ALT, ALKPHOS, BILITOT in the last 168 hours. Hematology Recent Labs  Lab 06/30/20 1950  WBC 5.1  RBC 4.97  HGB 14.7  HCT 45.3  MCV 91.1  MCH 29.6  MCHC 32.5  RDW 14.2  PLT 117*   BNPNo results for input(s): BNP, PROBNP in the last 168 hours.  DDimer No results for input(s): DDIMER in the last 168 hours.   Radiology/Studies:  DG Chest 2 View  Result Date: 06/30/2020 CLINICAL DATA:  Chest pain EXAM: CHEST - 2 VIEW COMPARISON:  03/09/2020 FINDINGS: Normal heart size.  No pleural effusion or edema identified. No airspace densities identified. Spondylosis identified within the thoracic spine. IMPRESSION: No acute cardiopulmonary abnormalities. Electronically Signed   By: Signa Kell M.D.   On: 06/30/2020 20:00   CT Angio Chest PE W and/or Wo Contrast  Result Date: 06/30/2020 CLINICAL DATA:  Sharp central chest pain, shortness of breath, pain radiating to neck EXAM: CT ANGIOGRAPHY CHEST WITH CONTRAST TECHNIQUE: Multidetector CT imaging of the chest was performed using the standard protocol during bolus administration of intravenous contrast. Multiplanar CT image reconstructions and MIPs were obtained to evaluate the vascular anatomy. CONTRAST:  29mL OMNIPAQUE IOHEXOL 350 MG/ML SOLN COMPARISON:  06/30/2020 FINDINGS: Cardiovascular: This is a technically adequate evaluation of the pulmonary vasculature. No filling defects or pulmonary emboli. The heart is enlarged without pericardial effusion. Extensive atherosclerosis throughout the coronary vasculature. Normal caliber of the thoracic aorta. Mild atherosclerosis of the aortic arch. Mediastinum/Nodes: No enlarged mediastinal, hilar, or axillary lymph nodes. Thyroid gland, trachea, and esophagus demonstrate no significant findings. Lungs/Pleura: Upper lobe predominant emphysema. No acute airspace disease, effusion, or pneumothorax. Central airway is patent. Upper Abdomen: No acute abnormality. Musculoskeletal: No acute or destructive bony lesions. Review of the MIP images confirms the above findings. IMPRESSION: 1. No evidence of pulmonary embolus. 2. No acute intrathoracic process. 3. Severe coronary artery atherosclerosis. 4. Aortic Atherosclerosis (ICD10-I70.0) and Emphysema (ICD10-J43.9). Electronically Signed   By: Sharlet Salina M.D.   On: 06/30/2020 23:09     Assessment and Plan:   Chest Pain History of CAD - Patient presented with chest pain that woke him up from sleep. Worse with deep inspiration,  bending over,  and panting cars. History of DES to LCX in 2015 and low risk Myoview in 2019. - EKG showed sinus tachycardia with a lot of underlying artifact but possible mild ST depression in V6. - High-sensitivity troponin negative x2. - Chest CTA negative for PE. - Echo pending. - Currently chest pain free. - Continue aspirin and high-intensity statin. Will add low dose beta-blocker. - Will check fasting lipid panel if here tomorrow morning. - He has some typical and atypical symptoms. However, he has known CAD and ongoing significant tobacco abuse. Discussed with Santiago - will plan for cardiac catheterization today.  Hypertension - Patient initially hypertensive with systolic BP as high as 170's. Improved but still mildly elevated. - Continue Lisinopril 20mg  daily. - Will add Coreg 3.125mg  twice daily for additional BP and heart rate control.  TIMI Risk Score for Unstable Angina or Non-ST Elevation MI:   The patient's TIMI risk score is 4, which indicates a 20% risk of all cause mortality, new or recurrent myocardial infarction or need for urgent revascularization in the next 14 days.   For questions or updates, please contact [facility] Please consult [email or web address] for contact info under    Signed, , Santiago-C  07/01/2020 3:10 PM   History and all data above reviewed.  Patient examined.  I agree with the findings as above.  The patient presents with chest discomfort.  The past cardiac history is as above.  He is very stoic.  The chest discomfort woke him from his sleep.  It was 5 out 10.  He has very hard time qualifying this.  It was heavy but not sharp.  It took his breath away.  He said it felt like he just could not breathe.  He does not think he had this before.  There was no jaw or arm discomfort.  There was no nausea vomiting or diaphoresis.  He did not have any palpitations.  He smoked a cigarette and drank some coffee.  He went on to work but he was so uncomfortable he came home  driving from [place] to [place].  His wife finally brought him to the emergency room.  He had some morphine and aspirin and his pain went away.  He did not have any acute EKG changes and his initial troponin was negative.  He is not sure that he has ever had this kind of discomfort before.   Patient exam reveals COR: Regular rate and rhythm, no murmur,  Lungs: Decreased breath sounds without wheezing or crackles,  Abd: Positive bowel sounds normal in frequency in pitch, Ext no edema.   Carotid:  Right bruit.  All available labs, radiology testing, previous Santiago reviewed. Agree with documented assessment and plan. Chest pain:  Unstable angina.  I think the pretest probability of obstructive coronary disease as the etiology of these complaints is very high.  CT was negative for PE or aortic dissection.  He has significant ongoing risk factors.  Cardiac catheterization is indicated. The patient understands that risks included but are not limited to stroke (1 in 1000), death (1 in 1000), kidney failure [usually temporary] (1 in 500), bleeding (1 in 200), allergic reaction [possibly serious] (1 in 200).  The patient understands and agrees to proceed.   Bruit: I will order carotid Doppler.  Tobacco abuse: He will be educated.  Risk reduction: Check his lipids.  He has been compliant with his statin.  [place]  3:38 PM  07/01/2020

## 2020-07-01 NOTE — ED Notes (Signed)
Pt up to bathroom; ambulates without difficulty. 

## 2020-07-02 ENCOUNTER — Encounter (HOSPITAL_COMMUNITY): Payer: Self-pay | Admitting: Cardiovascular Disease

## 2020-07-02 DIAGNOSIS — F17210 Nicotine dependence, cigarettes, uncomplicated: Secondary | ICD-10-CM | POA: Diagnosis not present

## 2020-07-02 DIAGNOSIS — R079 Chest pain, unspecified: Secondary | ICD-10-CM | POA: Diagnosis not present

## 2020-07-02 DIAGNOSIS — I10 Essential (primary) hypertension: Secondary | ICD-10-CM | POA: Diagnosis not present

## 2020-07-02 DIAGNOSIS — Z955 Presence of coronary angioplasty implant and graft: Secondary | ICD-10-CM

## 2020-07-02 DIAGNOSIS — I25119 Atherosclerotic heart disease of native coronary artery with unspecified angina pectoris: Secondary | ICD-10-CM | POA: Diagnosis not present

## 2020-07-02 LAB — CBC WITH DIFFERENTIAL/PLATELET
Abs Immature Granulocytes: 0.01 10*3/uL (ref 0.00–0.07)
Basophils Absolute: 0 10*3/uL (ref 0.0–0.1)
Basophils Relative: 1 %
Eosinophils Absolute: 0.1 10*3/uL (ref 0.0–0.5)
Eosinophils Relative: 1 %
HCT: 41.9 % (ref 39.0–52.0)
Hemoglobin: 13.3 g/dL (ref 13.0–17.0)
Immature Granulocytes: 0 %
Lymphocytes Relative: 24 %
Lymphs Abs: 0.9 10*3/uL (ref 0.7–4.0)
MCH: 28.6 pg (ref 26.0–34.0)
MCHC: 31.7 g/dL (ref 30.0–36.0)
MCV: 90.1 fL (ref 80.0–100.0)
Monocytes Absolute: 0.4 10*3/uL (ref 0.1–1.0)
Monocytes Relative: 11 %
Neutro Abs: 2.2 10*3/uL (ref 1.7–7.7)
Neutrophils Relative %: 63 %
Platelets: 99 10*3/uL — ABNORMAL LOW (ref 150–400)
RBC: 4.65 MIL/uL (ref 4.22–5.81)
RDW: 13.8 % (ref 11.5–15.5)
WBC: 3.6 10*3/uL — ABNORMAL LOW (ref 4.0–10.5)
nRBC: 0 % (ref 0.0–0.2)

## 2020-07-02 LAB — GLUCOSE, CAPILLARY: Glucose-Capillary: 101 mg/dL — ABNORMAL HIGH (ref 70–99)

## 2020-07-02 LAB — BASIC METABOLIC PANEL
Anion gap: 8 (ref 5–15)
BUN: 14 mg/dL (ref 8–23)
CO2: 25 mmol/L (ref 22–32)
Calcium: 8.8 mg/dL — ABNORMAL LOW (ref 8.9–10.3)
Chloride: 105 mmol/L (ref 98–111)
Creatinine, Ser: 1.07 mg/dL (ref 0.61–1.24)
GFR, Estimated: >60 mL/min (ref >60)
Glucose, Bld: 96 mg/dL (ref 70–99)
Potassium: 3.6 mmol/L (ref 3.5–5.1)
Sodium: 138 mmol/L (ref 135–145)

## 2020-07-02 LAB — LIPID PANEL
Cholesterol: 103 mg/dL (ref 0–200)
HDL: 36 mg/dL — ABNORMAL LOW (ref >40)
LDL Cholesterol: 51 mg/dL (ref 0–99)
Total CHOL/HDL Ratio: 2.9 RATIO
Triglycerides: 78 mg/dL (ref <150)
VLDL: 16 mg/dL (ref 0–40)

## 2020-07-02 MED ORDER — NITROGLYCERIN 0.4 MG SL SUBL: 0.4000 mg | SUBLINGUAL_TABLET | SUBLINGUAL | 2 refills | Status: AC | PRN

## 2020-07-02 MED ORDER — CARVEDILOL 3.125 MG PO TABS
3.1250 mg | ORAL_TABLET | Freq: Two times a day (BID) | ORAL | 1 refills | Status: DC
Start: 2020-07-02 — End: 2020-12-06

## 2020-07-02 MED ORDER — CLOPIDOGREL BISULFATE 75 MG PO TABS: 75.0000 mg | ORAL_TABLET | Freq: Every day | ORAL | 11 refills | Status: DC

## 2020-07-02 MED FILL — Nitroglycerin IV Soln 100 MCG/ML in D5W: INTRA_ARTERIAL | Qty: 10 | Status: AC

## 2020-07-02 NOTE — Progress Notes (Addendum)
Progress Note  Patient Name: Wyatt Santiago Date of Encounter: 07/02/2020  Primary Cardiologist:  Dina Rich, MD   Subjective   Doing well, no recurrent chest pain. Walking with cardiac rehab now. Wants to go home   Inpatient Medications    Scheduled Meds: . aspirin  81 mg Oral Daily  . atorvastatin  40 mg Oral q1800  . carvedilol  3.125 mg Oral BID WC  . clopidogrel  75 mg Oral Q breakfast  . gabapentin  300 mg Oral Daily  . lisinopril  20 mg Oral Daily  . nicotine  21 mg Transdermal Daily  . sodium chloride flush  3 mL Intravenous Q12H   Continuous Infusions: . sodium chloride     PRN Meds: sodium chloride, acetaminophen, melatonin, ondansetron (ZOFRAN) IV, sodium chloride flush   Vital Signs    Vitals:   07/01/20 1851 07/01/20 1946 07/01/20 2357 07/02/20 0344  BP: (!) 142/92 (!) 168/88  136/80  Pulse: 89  88 72  Resp: Temp: 98.1 F (36.7 C) 98 F (36.7 C) 98 F (36.7 C) 98.7 F (37.1 C)  TempSrc: Oral Oral Oral Oral  SpO2: 99%  100% 99%  Weight:    85.8 kg    Intake/Output Summary (Last 24 hours) at 07/02/2020 0805 Last data filed at 07/02/2020 0300 Gross per 24 hour  Intake 360 ml  Output 525 ml  Net -165 ml   Filed Weights   07/02/20 0344  Weight: 85.8 kg    Physical Exam   General: Well developed, well nourished, NAD Neck: Negative for carotid bruits. No JVD Lungs:Clear to ausculation bilaterally. No wheezes, rales, or rhonchi. Breathing is unlabored. Cardiovascular: RRR with S1 S2. No murmurs Extremities: No edema. Radial pulses 2+ bilaterally Neuro: Alert and oriented. No focal deficits. No facial asymmetry. MAE spontaneously. Psych: Responds to questions appropriately with normal affect.    Labs    Chemistry Recent Labs  Lab 06/30/20 1950 07/02/20 0309  NA 140 138  K 4.0 3.6  CL 106 105  CO2 25 25  GLUCOSE 108* 96  BUN 16 14  CREATININE 1.04 1.07  CALCIUM 9.3 8.8*  GFRNONAA >60 >60  ANIONGAP 9 8      Hematology Recent Labs  Lab 06/30/20 1950 07/02/20 0309  WBC 5.1 3.6*  RBC 4.97 4.65  HGB 14.7 13.3  HCT 45.3 41.9  MCV 91.1 90.1  MCH 29.6 28.6  MCHC 32.5 31.7  RDW 14.2 13.8  PLT 117* 99*    Cardiac EnzymesNo results for input(s): TROPONINI in the last 168 hours. No results for input(s): TROPIPOC in the last 168 hours.   BNPNo results for input(s): BNP, PROBNP in the last 168 hours.   DDimer No results for input(s): DDIMER in the last 168 hours.   Radiology    DG Chest 2 View  Result Date: 06/30/2020 CLINICAL DATA:  Chest pain EXAM: CHEST - 2 VIEW COMPARISON:  03/09/2020 FINDINGS: Normal heart size. No pleural effusion or edema identified. No airspace densities identified. Spondylosis identified within the thoracic spine. IMPRESSION: No acute cardiopulmonary abnormalities. Electronically Signed   By: Signa Kell M.D.   On: 06/30/2020 20:00   CT Angio Chest PE W and/or Wo Contrast  Result Date: 06/30/2020 CLINICAL DATA:  Sharp central chest pain, shortness of breath, pain radiating to neck EXAM: CT ANGIOGRAPHY CHEST WITH CONTRAST TECHNIQUE: Multidetector CT imaging of the chest was performed using the standard protocol during bolus administration of  intravenous contrast. Multiplanar CT image reconstructions and MIPs were obtained to evaluate the vascular anatomy. CONTRAST:  101mL OMNIPAQUE IOHEXOL 350 MG/ML SOLN COMPARISON:  06/30/2020 FINDINGS: Cardiovascular: This is a technically adequate evaluation of the pulmonary vasculature. No filling defects or pulmonary emboli. The heart is enlarged without pericardial effusion. Extensive atherosclerosis throughout the coronary vasculature. Normal caliber of the thoracic aorta. Mild atherosclerosis of the aortic arch. Mediastinum/Nodes: No enlarged mediastinal, hilar, or axillary lymph nodes. Thyroid gland, trachea, and esophagus demonstrate no significant findings. Lungs/Pleura: Upper lobe predominant emphysema. No acute airspace  disease, effusion, or pneumothorax. Central airway is patent. Upper Abdomen: No acute abnormality. Musculoskeletal: No acute or destructive bony lesions. Review of the MIP images confirms the above findings. IMPRESSION: 1. No evidence of pulmonary embolus. 2. No acute intrathoracic process. 3. Severe coronary artery atherosclerosis. 4. Aortic Atherosclerosis (ICD10-I70.0) and Emphysema (ICD10-J43.9). Electronically Signed   By: Sharlet Salina M.D.   On: 06/30/2020 23:09   CARDIAC CATHETERIZATION  Result Date: 07/01/2020  Ost 1st Mrg lesion is 40% stenosed.  Mid Cx lesion is 90% stenosed.  Prox RCA to Mid RCA lesion is 40% stenosed.  1st Mrg lesion is 60% stenosed.  Mid LAD lesion is 50% stenosed.  The left ventricular ejection fraction is 55-65% by visual estimate.  The left ventricular systolic function is normal.  LV end diastolic pressure is normal.  1. Mild to moderate mid LAD stenosis 2. Patent proximal Circumflex stent. Severe stenosis mid Circumflex just beyond the old stent. Moderately severe ostial stenosis in the moderate caliber obtuse marginal branch arising just before the severe mid Circumflex stenosis. 3. Successful PTCA/DES x 1 mid Circumflex 4. Mild stenosis mid RCA 5. Normal LV systolic function Recommendations: Continue DAPT with ASA and Plavix for at least 12 months as this event likely represented a ruptured plaque even though his troponin was negative. Continue statin and beta blocker.   ECHOCARDIOGRAM LIMITED  Result Date: 07/01/2020    ECHOCARDIOGRAM REPORT   Patient Name:   Wyatt Santiago Date of Exam: 07/01/2020 Medical Rec #:  [id number]      Height:       72.0 in Accession #:    [id number]     Weight:       203.4 lb Date of Birth:  28-Sep-1960     BSA:          2.146 m Patient Age:    59 years       BP:           160/90 mmHg Patient Gender: M              HR:           78 bpm. Exam Location:  Inpatient Procedure: Limited Echo, Limited Color Doppler and Cardiac Doppler  Indications:    chest pain  History:        Patient has prior history of Echocardiogram examinations, most                 recent 07/26/2015. Risk Factors:Current Smoker and Hypertension.  Sonographer:    Delcie Roch Referring Phys: [id number] SHAYLA D NETTEY IMPRESSIONS  1. Left ventricular ejection fraction, by estimation, is 55 to 60%. The left ventricle has normal function. The left ventricle has no regional wall motion abnormalities. Left ventricular diastolic parameters are consistent with Grade I diastolic dysfunction (impaired relaxation).  2. Right ventricular systolic function is normal. The right ventricular size is normal. Tricuspid regurgitation signal is inadequate for assessing  PA pressure.  3. The mitral valve is normal in structure. No evidence of mitral valve regurgitation. No evidence of mitral stenosis.  4. The aortic valve is tricuspid. Aortic valve regurgitation is not visualized. No aortic stenosis is present.  5. Aortic dilatation noted. There is mild dilatation of the ascending aorta, measuring 38 mm.  6. The inferior vena cava is normal in size with greater than 50% respiratory variability, suggesting right atrial pressure of 3 mmHg. FINDINGS  Left Ventricle: Left ventricular ejection fraction, by estimation, is 55 to 60%. The left ventricle has normal function. The left ventricle has no regional wall motion abnormalities. The left ventricular internal cavity size was normal in size. There is  no left ventricular hypertrophy. Left ventricular diastolic parameters are consistent with Grade I diastolic dysfunction (impaired relaxation). Right Ventricle: The right ventricular size is normal. No increase in right ventricular wall thickness. Right ventricular systolic function is normal. Tricuspid regurgitation signal is inadequate for assessing PA pressure. Left Atrium: Left atrial size was normal in size. Right Atrium: Right atrial size was normal in size. Pericardium: There is no evidence  of pericardial effusion. Mitral Valve: The mitral valve is normal in structure. No evidence of mitral valve regurgitation. No evidence of mitral valve stenosis. Tricuspid Valve: The tricuspid valve is normal in structure. Tricuspid valve regurgitation is not demonstrated. Aortic Valve: The aortic valve is tricuspid. Aortic valve regurgitation is not visualized. No aortic stenosis is present. Pulmonic Valve: The pulmonic valve was normal in structure. Pulmonic valve regurgitation is not visualized. Aorta: Aortic dilatation noted. There is mild dilatation of the ascending aorta, measuring 38 mm. Venous: The inferior vena cava is normal in size with greater than 50% respiratory variability, suggesting right atrial pressure of 3 mmHg. IAS/Shunts: No atrial level shunt detected by color flow Doppler.  LEFT VENTRICLE PLAX 2D LVIDd:         5.50 cm Diastology LVIDs:         3.90 cm LV e' medial:    8.05 cm/s LV PW:         1.10 cm LV E/e' medial:  9.5 LV IVS:        0.90 cm LV e' lateral:   9.68 cm/s                        LV E/e' lateral: 7.9  LEFT ATRIUM         Index LA diam:    4.40 cm 2.05 cm/m  AORTIC VALVE LVOT Vmax:   87.50 cm/s LVOT Vmean:  59.600 cm/s LVOT VTI:    0.200 m  AORTA Ao Asc diam: 3.80 cm MITRAL VALVE MV Area (PHT): 3.77 cm    SHUNTS MV Decel Time: 201 msec    Systemic VTI: 0.20 m MV E velocity: 76.70 cm/s MV A velocity: 75.00 cm/s MV E/A ratio:  1.02 Marca Ancona MD Electronically signed by Marca Ancona MD Signature Date/Time: 07/01/2020/3:13:20 PM    Final    Telemetry    07/02/20 NSR- Personally Reviewed  ECG    No new tracing as of 07/02/20- Personally Reviewed  Cardiac Studies   LHC 07/01/20:   Ost 1st Mrg lesion is 40% stenosed.  Mid Cx lesion is 90% stenosed.  Prox RCA to Mid RCA lesion is 40% stenosed.  1st Mrg lesion is 60% stenosed.  Mid LAD lesion is 50% stenosed.  The left ventricular ejection fraction is 55-65% by visual estimate.  The left ventricular  systolic function is normal.  LV end diastolic pressure is normal.   1. Mild to moderate mid LAD stenosis 2. Patent proximal Circumflex stent. Severe stenosis mid Circumflex just beyond the old stent. Moderately severe ostial stenosis in the moderate caliber obtuse marginal branch arising just before the severe mid Circumflex stenosis.  3. Successful PTCA/DES x 1 mid Circumflex 4. Mild stenosis mid RCA 5. Normal LV systolic function  Recommendations: Continue DAPT with ASA and Plavix for at least 12 months as this event likely represented a ruptured plaque even though his troponin was negative. Continue statin and beta blocker.    Echocardiogram 07/01/20:  1. Left ventricular ejection fraction, by estimation, is 55 to 60%. The  left ventricle has normal function. The left ventricle has no regional  wall motion abnormalities. Left ventricular diastolic parameters are  consistent with Grade I diastolic  dysfunction (impaired relaxation).  2. Right ventricular systolic function is normal. The right ventricular  size is normal. Tricuspid regurgitation signal is inadequate for assessing  PA pressure.  3. The mitral valve is normal in structure. No evidence of mitral valve  regurgitation. No evidence of mitral stenosis.  4. The aortic valve is tricuspid. Aortic valve regurgitation is not  visualized. No aortic stenosis is present.  5. Aortic dilatation noted. There is mild dilatation of the ascending  aorta, measuring 38 mm.  6. The inferior vena cava is normal in size with greater than 50%  respiratory variability, suggesting right atrial pressure of 3 mmHg.   Left Cardiac Catheterization 04/19/2015:  1. Mid LAD to Dist LAD lesion, 40% stenosed. 2. Mid RCA to Dist RCA lesion, 20% stenosed. 3. Ost 1st Mrg lesion, 40% stenosed. 4. Mid Cx-2 lesion, 50% stenosed. 5. Prox Cx lesion, 90% stenosed. There is a 0% residual stenosis post intervention. 6. A drug-eluting stent was  placed.   Unstable angina pectoris due to high-grade obstruction in the proximal circumflex with in a tortuous segment.  Successful PCI with Synergy drug-eluting stent implantation and reduction in 90% stenosis to 0% with TIMI grade 3 flow.  Mild residual first obtuse marginal and mid circumflex obstruction less than 50%. Less than 50% obstruction in the mid LAD.  Overall mildly reduced LV function with mid anterior wall hypokinesis and estimated ejection fraction 45%.  Recommendations:  Dual antiplatelet therapy (should probably switch to clopidogrel and aspirin after one month). For the time being and should remain on Brilinta and aspirin.  Potentially able to discontinue gap therapy after 6 months using Synergy.  Potentially eligible for discharge in a.m. assuming no complications.  Ethanol abuse needs to be addressed with the patient and consideration of a plan for cessation instituted. _______________  Myoview 01/30/2018:  Blood pressure demonstrated a hypertensive response to exercise.  Nonspecific ST changes with stress which resolved in recovery (leads II and III).  Defect 1: There is a medium defect of mild severity present in the mid inferoseptal and mid inferior location. This appears to be due to soft tissue attenuation. There were no significant ischemic territories.  This is a low risk study.  Nuclear stress EF: 58%.  Patient Profile     59 y.o. male with a history of CAD s/p DES to LCX in 03/2015, paroxysmal SVT, hypertension, thrombocytopenia, seizures as a child who is being seen today for the evaluation of chest pain at the request of Dr. Caleb Popp.  Assessment & Plan    1. Chest Pain with a hx of CAD and prior stenting: -History of DES  to LCX in 2015 and low risk Myoview in 2019 who presented with chest pain  -LHC performed 07/01/20 which showed patent proximal LCX stent and new severe stenosis mid LCX just beyond the old stent with successful PTCA/DES x 1  mid Circumflex -Plan for DAPT with ASA and Plavix for at least 1 year  -Reports he is to have dental work with 6 teeth to be pulled>>discussed that he will need to wait to have this done as he cannot stop ASA and Plavix for at least one year  -Continue beta blocker and statin   2. Hypertension: -Stable, 134/74>>136/80>>168/88 -Continue lisinopril and carvedilol   3. Tobacco use: -Reports ongoing tobacco use with 2PPDx 40 years  -Cessation encouraged however patient not ready to quit    Signed, Georgie ChardJill McDaniel NP-C [facility] Pager: [phone] 07/02/2020, 8:05 AM     For questions or updates, please contact   Please consult [email or web address] for contact info under Cardiology/STEMI.  History and all data above reviewed.  Patient examined.  No chest pain.  No SOB. I agree with the findings as above.  The patient exam reveals COR:RRR  ,  Lungs: Clear  ,  Abd: Positive bowel sounds, no rebound no guarding, Ext Right radial without bleeding or bruising.   .  All available labs, radiology testing, previous records reviewed. Agree with documented assessment and plan.   UNSTABLE ANGINA:  Ok to discharge. TOBACCO:  I encouraged him to buy the patches and call [address] FannyQUITNOW   Elihue Ebert  9:59 AM  07/02/2020

## 2020-07-02 NOTE — Progress Notes (Signed)
Pt refused CBG monitoring and insulin. Pt stated that his last HA1c was within normal range and that he no longer takes insulin. Pt educated on efforts in continuing to monitor.   Bari Edward

## 2020-07-02 NOTE — Progress Notes (Signed)
CARDIAC REHAB PHASE I   PRE:  Rate/Rhythm: 85 SR  BP:  Sitting: 134/74      SaO2: 98 RA  MODE:  Ambulation: 350 ft   POST:  Rate/Rhythm: 96 RA  BP:  Sitting: 150/87    SaO2: 97 RA   Pt ambulated 366ft in hallway independently with steady gait. Pt denies pain, dizziness, or SOB. Pt educated on importance of ASA and Plavix. Pt given heart healthy diet and smoking cessation tip sheet. Reviewed restrictions, site care, and exercise guidelines. Pt requesting note for work, NP made aware. Will refer to CRP II [place].  [id number] Reynold Bowen, RN BSN 07/02/2020 8:56 AM

## 2020-07-02 NOTE — Discharge Instructions (Signed)
Information about your medication: Plavix (anti-platelet agent)  Generic Name (Brand): clopidogrel (Plavix), once daily medication  PURPOSE: You are taking this medication along with aspirin to lower your chance of having a heart attack, stroke, or blood clots in your heart stent. These can be fatal. Plavix and aspirin help prevent platelets from sticking together and forming a clot that can block an artery or your stent.   Common SIDE EFFECTS you may experience include: bruising or bleeding more easily, shortness of breath  Do not stop taking PLAVIX without talking to the doctor who prescribes it for you. People who are treated with a stent and stop taking Plavix too soon, have a higher risk of getting a blood clot in the stent, having a heart attack, or dying. If you stop Plavix because of bleeding, or for other reasons, your risk of a heart attack or stroke may increase.   Avoid taking NSAID agents or anti-inflammatory medications such as ibuprofen, naproxen given increased bleed risk with plavix - can use acetaminophen (Tylenol) if needed for pain.  Avoid taking over the counter stomach medications omeprazole (Prilosec) or esomeprazole (Nexium) since these do interact and make plavix less effective - ask your pharmacist or doctor for alterative agents if needed for heartburn or GERD.   Tell all of your doctors and dentists that you are taking Plavix. They should talk to the doctor who prescribed Plavix for you before you have any surgery or invasive procedure.   Contact your health care provider if you experience: severe or uncontrollable bleeding, pink/red/Pieratt urine, vomiting blood or vomit that looks like "coffee grounds", red or black stools (looks like tar), coughing up blood or blood clots ----------------------------------------------------------------------------------------------------------------------  

## 2020-07-02 NOTE — Discharge Summary (Signed)
Wyatt Santiago KNL:[id number] DOB: 10/04/1960 DOA: 06/30/2020  PCP: [facility]  Admit date: 06/30/2020 Discharge date: 07/02/2020  Admitted From: home Disposition:  home  Recommendations for Outpatient Follow-up:  1. New medications: Aspirin and Plavix x1 year, Coreg added for better blood pressure control 2. Follow with PCP.  Continue outpatient follow-up with cardiology 3. Continue to advise tobacco cessation conversations  Home Health: None Equipment/Devices:None Discharge Condition: Stable CODE STATUS full Diet recommendation: Heart healthy  Brief/Interim Summary: Hospital Course:   Wyatt Santiago is a 59 year old male with medical history significant for CAD stents in 2016, smoking, alcohol abuse, HTN who presented on 11/10 with complaints of chest pain that started in the morning woke him up from his sleep, waxing and waning throughout the day.  Patient was admitted under observation with working diagnosis of acute coronary syndrome.  CAD history [echo which is a chronic chest pain.  Patient underwent CTA chest which was negative for PE or any other acute pathology.  His troponin trend was negative x2.  His EKG showed no acute ischemic changes.  .  Cardiology was consulted  Chest pain with some typical features in a patient with CAD, resolved.    Consistent with unstable angina, troponin trend was unremarkable, underwent left heart cath on 11/11 with DES to mid circumflex.  Patient chest pain-free on day of discharge. -Continue aspirin and Plavix x1 year -Continue statin -Outpatient cardiology follow-up   HTN, PAD,  SBP was elevated in the 150s to 170s during hospital stay.  Achieved better blood pressure control with addition of Coreg -continue home ACE inhibitor, Coreg 3.125 mg twice daily added can continue on discharge  History of alcohol abuse.  No current signs or symptoms of withdrawal  Tobacco use.  In precontemplation stage, not ready to  quit -Continue cessation conversation as outpatient   Consultations:  Cardiology  Procedures/Studies: Left heart cath, 07/01/2020  Ost 1st Mrg lesion is 40% stenosed.  Mid Cx lesion is 90% stenosed.  Prox RCA to Mid RCA lesion is 40% stenosed.  1st Mrg lesion is 60% stenosed.  Mid LAD lesion is 50% stenosed.  The left ventricular ejection fraction is 55-65% by visual estimate.  The left ventricular systolic function is normal.  LV end diastolic pressure is normal.   1. Mild to moderate mid LAD stenosis 2. Patent proximal Circumflex stent. Severe stenosis mid Circumflex just beyond the old stent. Moderately severe ostial stenosis in the moderate caliber obtuse marginal branch arising just before the severe mid Circumflex stenosis.  3. Successful PTCA/DES x 1 mid Circumflex 4. Mild stenosis mid RCA 5. Normal LV systolic function  Recommendations: Continue DAPT with ASA and Plavix for at least 12 months as this event likely represented a ruptured plaque even though his troponin was negative. Continue statin and beta blocker.   Subjective: Feels well, ready to go home, no chest pain  Discharge Exam: Vitals:   07/02/20 0344 07/02/20 0812  BP: 136/80 134/74  Pulse: 72 86  Resp: 18 18  Temp: 98.7 Santiago (37.1 C) 97.9 Santiago (36.6 C)  SpO2: 99% 98%   Vitals:   07/01/20 1946 07/01/20 2357 07/02/20 0344 07/02/20 0812  BP: (!) 168/88  136/80 134/74  Pulse:  88 72 86  Resp: 18 18 18 18   Temp: 98 Santiago (36.7 C) 98 Santiago (36.7 C) 98.7 Santiago (37.1 C) 97.9 Santiago (36.6 C)  TempSrc: Oral Oral Oral Oral  SpO2:  100% 99% 98%  Weight:   85.8 kg  General: Lying in bed, no apparent distress Eyes: EOMI, anicteric ENT: Oral Mucosa clear and moist Cardiovascular: regular rate and rhythm, no murmurs, rubs or gallops, no edema, Respiratory: Normal respiratory effort on room air, lungs clear to auscultation bilaterally Abdomen: soft, non-distended, non-tender, normal bowel sounds Neurologic:  Grossly no focal neuro deficit.Mental status AAOx3, speech normal, Psychiatric:Appropriate affect, and mood  Discharge Diagnoses:  Principal Problem:   Chest pain, rule out acute myocardial infarction Active Problems:   HTN (hypertension)   Alcohol abuse   Cigarette smoker   CAD (coronary artery disease), native coronary artery    Discharge Instructions  Discharge Instructions    Amb Referral to Cardiac Rehabilitation   Complete by: As directed    Diagnosis: Coronary Stents   After initial evaluation and assessments completed: Virtual Based Care Santiago be provided alone or in conjunction with Phase 2 Cardiac Rehab based on patient barriers.: Yes   Diet - low sodium heart healthy   Complete by: As directed    Increase activity slowly   Complete by: As directed      Allergies as of 07/02/2020   No Known Allergies     Medication List    TAKE these medications   aspirin 81 MG chewable tablet Chew 1 tablet (81 mg total) by mouth daily.   atorvastatin 40 MG tablet Commonly known as: LIPITOR TAKE 1 TABLET BY MOUTH EVERY DAY AT 6PM What changed: Another medication with the same name was removed. Continue taking this medication, and follow the directions you see here.   carvedilol 3.125 MG tablet Commonly known as: COREG Take 1 tablet (3.125 mg total) by mouth 2 (two) times daily with a meal.   clopidogrel 75 MG tablet Commonly known as: PLAVIX Take 1 tablet (75 mg total) by mouth daily with breakfast. Start taking on: July 03, 2020   gabapentin 300 MG capsule Commonly known as: NEURONTIN Take 300 mg by mouth daily.   lisinopril 20 MG tablet Commonly known as: ZESTRIL TAKE 1 TABLET BY MOUTH EVERY DAY   nitroGLYCERIN 0.4 MG SL tablet Commonly known as: NITROSTAT Place 1 tablet (0.4 mg total) under the tongue every 5 (five) minutes as needed for chest pain (up to 3 doses. If no relief after 3rd dose, proceed to the ED for an evaluation).       Follow-up  Information    Wyatt Santiago, Wyatt F, MD Follow up on 07/07/2020.   Specialty: Cardiology Why: at 1140am  Contact information: [address] [place] [id number] [phone]              No Known Allergies      The results of significant diagnostics from this hospitalization (including imaging, microbiology, ancillary and laboratory) are listed below for reference.     Microbiology: Recent Results (from the past 240 hour(s))  Respiratory Panel by RT PCR (Flu A&B, Covid) - Nasopharyngeal Swab     Status: None   Collection Time: 06/30/20  8:53 PM   Specimen: Nasopharyngeal Swab  Result Value Ref Range Status   SARS Coronavirus 2 by RT PCR NEGATIVE NEGATIVE Final    Comment: (NOTE) SARS-CoV-2 target nucleic acids are NOT DETECTED.  The SARS-CoV-2 RNA is generally detectable in upper respiratoy specimens during the acute phase of infection. The lowest concentration of SARS-CoV-2 viral copies this assay can detect is 131 copies/mL. A negative result does not preclude SARS-Cov-2 infection and should not be used as the sole basis for treatment or other patient  management decisions. A negative result Santiago occur with  improper specimen collection/handling, submission of specimen other than nasopharyngeal swab, presence of viral mutation(s) within the areas targeted by this assay, and inadequate number of viral copies (<131 copies/mL). A negative result must be combined with clinical observations, patient history, and epidemiological information. The expected result is Negative.  Fact Sheet for Patients:  https://www.moore.com/  Fact Sheet for Healthcare Providers:  https://www.young.biz/  This test is no t yet approved or cleared by the [place] FDA and  has been authorized for detection and/or diagnosis of SARS-CoV-2 by FDA under an Emergency Use Authorization (EUA). This EUA will remain  in effect (meaning this test  can be used) for the duration of the COVID-19 declaration under Section 564(b)(1) of the Act, 21 U.S.C. section 360bbb-3(b)(1), unless the authorization is terminated or revoked sooner.     Influenza A by PCR NEGATIVE NEGATIVE Final   Influenza B by PCR NEGATIVE NEGATIVE Final    Comment: (NOTE) The Xpert Xpress SARS-CoV-2/FLU/RSV assay is intended as an aid in  the diagnosis of influenza from Nasopharyngeal swab specimens and  should not be used as a sole basis for treatment. Nasal washings and  aspirates are unacceptable for Xpert Xpress SARS-CoV-2/FLU/RSV  testing.  Fact Sheet for Patients: https://www.moore.com/  Fact Sheet for Healthcare Providers: https://www.young.biz/  This test is not yet approved or cleared by the [place] FDA and  has been authorized for detection and/or diagnosis of SARS-CoV-2 by  FDA under an Emergency Use Authorization (EUA). This EUA will remain  in effect (meaning this test can be used) for the duration of the  Covid-19 declaration under Section 564(b)(1) of the Act, 21  U.S.C. section 360bbb-3(b)(1), unless the authorization is  terminated or revoked. Performed at [facility], [address] [place], [place] [address]      Labs: BNP (last 3 results) No results for input(s): BNP in the last 8760 hours. Basic Metabolic Panel: Recent Labs  Lab 06/30/20 1950 07/02/20 0309  NA 140 138  K 4.0 3.6  CL 106 105  CO2 25 25  GLUCOSE 108* 96  BUN 16 14  CREATININE 1.04 1.07  CALCIUM 9.3 8.8*   Liver Function Tests: No results for input(s): AST, ALT, ALKPHOS, BILITOT, PROT, ALBUMIN in the last 168 hours. No results for input(s): LIPASE, AMYLASE in the last 168 hours. No results for input(s): AMMONIA in the last 168 hours. CBC: Recent Labs  Lab 06/30/20 1950 07/02/20 0309  WBC 5.1 3.6*  NEUTROABS  --  2.2  HGB 14.7 13.3  HCT 45.3 41.9  MCV 91.1 90.1  PLT 117* 99*   Cardiac  Enzymes: No results for input(s): CKTOTAL, CKMB, CKMBINDEX, TROPONINI in the last 168 hours. BNP: Invalid input(s): POCBNP CBG: Recent Labs  Lab 07/01/20 0801 07/01/20 1144 07/01/20 1154 07/01/20 2025 07/02/20 0644  GLUCAP 85 91 85 171* 101*   D-Dimer No results for input(s): DDIMER in the last 72 hours. Hgb A1c Recent Labs    06/30/20 2348  HGBA1C 5.0   Lipid Profile Recent Labs    07/02/20 0309  CHOL 103  HDL 36*  LDLCALC 51  TRIG 78  CHOLHDL 2.9   Thyroid function studies No results for input(s): TSH, T4TOTAL, T3FREE, THYROIDAB in the last 72 hours.  Invalid input(s): FREET3 Anemia work up No results for input(s): VITAMINB12, FOLATE, FERRITIN, TIBC, IRON, RETICCTPCT in the last 72 hours. Urinalysis    Component Value Date/Time   COLORURINE STRAW (  A) 01/13/2018 0337   APPEARANCEUR CLEAR 01/13/2018 0337   LABSPEC 1.004 (L) 01/13/2018 0337   PHURINE 5.0 01/13/2018 0337   GLUCOSEU NEGATIVE 01/13/2018 0337   HGBUR SMALL (A) 01/13/2018 0337   BILIRUBINUR NEGATIVE 01/13/2018 0337   KETONESUR NEGATIVE 01/13/2018 0337   PROTEINUR NEGATIVE 01/13/2018 0337   NITRITE NEGATIVE 01/13/2018 0337   LEUKOCYTESUR NEGATIVE 01/13/2018 0337   Sepsis Labs Invalid input(s): PROCALCITONIN,  WBC,  LACTICIDVEN Microbiology Recent Results (from the past 240 hour(s))  Respiratory Panel by RT PCR (Flu A&B, Covid) - Nasopharyngeal Swab     Status: None   Collection Time: 06/30/20  8:53 PM   Specimen: Nasopharyngeal Swab  Result Value Ref Range Status   SARS Coronavirus 2 by RT PCR NEGATIVE NEGATIVE Final    Comment: (NOTE) SARS-CoV-2 target nucleic acids are NOT DETECTED.  The SARS-CoV-2 RNA is generally detectable in upper respiratoy specimens during the acute phase of infection. The lowest concentration of SARS-CoV-2 viral copies this assay can detect is 131 copies/mL. A negative result does not preclude SARS-Cov-2 infection and should not be used as the sole basis for  treatment or other patient management decisions. A negative result Santiago occur with  improper specimen collection/handling, submission of specimen other than nasopharyngeal swab, presence of viral mutation(s) within the areas targeted by this assay, and inadequate number of viral copies (<131 copies/mL). A negative result must be combined with clinical observations, patient history, and epidemiological information. The expected result is Negative.  Fact Sheet for Patients:  https://www.moore.com/  Fact Sheet for Healthcare Providers:  https://www.young.biz/  This test is no t yet approved or cleared by the [place] FDA and  has been authorized for detection and/or diagnosis of SARS-CoV-2 by FDA under an Emergency Use Authorization (EUA). This EUA will remain  in effect (meaning this test can be used) for the duration of the COVID-19 declaration under Section 564(b)(1) of the Act, 21 U.S.C. section 360bbb-3(b)(1), unless the authorization is terminated or revoked sooner.     Influenza A by PCR NEGATIVE NEGATIVE Final   Influenza B by PCR NEGATIVE NEGATIVE Final    Comment: (NOTE) The Xpert Xpress SARS-CoV-2/FLU/RSV assay is intended as an aid in  the diagnosis of influenza from Nasopharyngeal swab specimens and  should not be used as a sole basis for treatment. Nasal washings and  aspirates are unacceptable for Xpert Xpress SARS-CoV-2/FLU/RSV  testing.  Fact Sheet for Patients: https://www.moore.com/  Fact Sheet for Healthcare Providers: https://www.young.biz/  This test is not yet approved or cleared by the [place] FDA and  has been authorized for detection and/or diagnosis of SARS-CoV-2 by  FDA under an Emergency Use Authorization (EUA). This EUA will remain  in effect (meaning this test can be used) for the duration of the  Covid-19 declaration under Section 564(b)(1) of the Act, 21   U.S.C. section 360bbb-3(b)(1), unless the authorization is  terminated or revoked. Performed at [facility], [address] [place], [place] [address]      Time coordinating discharge: Over 30 minutes  SIGNED:   Laverna Peace, MD  [facility] 07/02/2020, 11:09 AM Pager   If 7PM-7AM, please contact night-coverage [email or web address] Password [email or web address]

## 2020-07-07 ENCOUNTER — Ambulatory Visit (INDEPENDENT_AMBULATORY_CARE_PROVIDER_SITE_OTHER): Payer: PRIVATE HEALTH INSURANCE | Admitting: Cardiology

## 2020-07-07 ENCOUNTER — Encounter: Payer: Self-pay | Admitting: *Deleted

## 2020-07-07 ENCOUNTER — Other Ambulatory Visit: Payer: Self-pay | Admitting: Cardiology

## 2020-07-07 ENCOUNTER — Other Ambulatory Visit: Payer: Self-pay

## 2020-07-07 ENCOUNTER — Encounter: Payer: Self-pay | Admitting: Cardiology

## 2020-07-07 VITALS — BP 120/70 | HR 74 | Ht 72.0 in | Wt 193.0 lb

## 2020-07-07 DIAGNOSIS — I10 Essential (primary) hypertension: Secondary | ICD-10-CM

## 2020-07-07 DIAGNOSIS — E782 Mixed hyperlipidemia: Secondary | ICD-10-CM | POA: Diagnosis not present

## 2020-07-07 DIAGNOSIS — I2510 Atherosclerotic heart disease of native coronary artery without angina pectoris: Secondary | ICD-10-CM

## 2020-07-07 NOTE — Patient Instructions (Signed)
Your physician wants you to follow-up in: 4 MONTHS   Your physician recommends that you continue on your current medications as directed. Please refer to the Current Medication list given to you today.  Thank you for choosing [facility]!!

## 2020-07-07 NOTE — Progress Notes (Signed)
Clinical Summary Wyatt Santiago is a 59 y.o.male 1. CAD - admit 03/2015 with chest pain. Received DES to LCX - 07/2015 echo LVEF 50-55%, abnormal diastolic function 01/2018 exercise nuclear stress test: exercised 8 min, no clear ischemia by imaging.    - admit 06/2020 with chest pain - -LHC performed 07/01/20 which showed patent proximal LCX stent and new severe stenosis mid LCX just beyond the old stent with successful PTCA/DES x 1 mid Circumflex 06/2020 echo: LVEF 55-60%, no WMAs, grade I ddx,   - no chest pain, no SOB or DOE. - compliant with meds.   2. PSVT - no recent symptoms  3. HTN -compliant with meds  4. Hyperlipidemia - he is compliant with statin  06/2020 TC 103 TG 78 HDL 36 LDL 51 Past Medical History:  Diagnosis Date  . Arthritis    "hands" (04/19/2015)  . Coronary artery disease    a. cath 04/19/15 1.  Mid LAD to Dist LAD lesion, 40% stenosed, S/p PCI and DES to prox LCx with LV EF of 45%.  . Hypertension   . Seizures (HCC)    "when I was a kid"  . Sleep apnea    "they say I've got it but I don't wear mask" (04/19/2015)  . SVT (supraventricular tachycardia) (HCC)   . Thrombocytopenia (HCC)      No Known Allergies   Current Outpatient Medications  Medication Sig Dispense Refill  . aspirin 81 MG chewable tablet Chew 1 tablet (81 mg total) by mouth daily.    Marland Kitchen atorvastatin (LIPITOR) 40 MG tablet TAKE 1 TABLET BY MOUTH EVERY DAY AT 6PM 15 tablet 0  . carvedilol (COREG) 3.125 MG tablet Take 1 tablet (3.125 mg total) by mouth 2 (two) times daily with a meal. 45 tablet 1  . clopidogrel (PLAVIX) 75 MG tablet Take 1 tablet (75 mg total) by mouth daily with breakfast. 30 tablet 11  . gabapentin (NEURONTIN) 300 MG capsule Take 300 mg by mouth daily.    Marland Kitchen lisinopril (ZESTRIL) 20 MG tablet TAKE 1 TABLET BY MOUTH EVERY DAY 90 tablet 1  . nitroGLYCERIN (NITROSTAT) 0.4 MG SL tablet Place 1 tablet (0.4 mg total) under the tongue every 5 (five) minutes as needed  for chest pain (up to 3 doses. If no relief after 3rd dose, proceed to the ED for an evaluation). 30 tablet 2   No current facility-administered medications for this visit.     Past Surgical History:  Procedure Laterality Date  . CARDIAC CATHETERIZATION N/A 04/19/2015   Procedure: Left Heart Cath and Coronary Angiography;  Surgeon: Lyn Records, MD;  Location: [facility] INVASIVE CV LAB;  Service: Cardiovascular;  Laterality: N/A;  . CARDIAC CATHETERIZATION N/A 04/19/2015   Procedure: Coronary Stent Intervention;  Surgeon: Lyn Records, MD;  Location: [facility] INVASIVE CV LAB;  Service: Cardiovascular;  Laterality: N/A;  . CORONARY ANGIOPLASTY WITH STENT PLACEMENT  04/19/2015   "1 stent"  . CORONARY STENT INTERVENTION N/A 07/01/2020   Procedure: CORONARY STENT INTERVENTION;  Surgeon: Kathleene Hazel, MD;  Location: [facility] INVASIVE CV LAB;  Service: Cardiovascular;  Laterality: N/A;  . EYE SURGERY Right    "lasered for macular degeneration"  . LEFT HEART CATH AND CORONARY ANGIOGRAPHY N/A 07/01/2020   Procedure: LEFT HEART CATH AND CORONARY ANGIOGRAPHY;  Surgeon: Kathleene Hazel, MD;  Location: [facility] INVASIVE CV LAB;  Service: Cardiovascular;  Laterality: N/A;     No Known Allergies    Family History  Problem Relation  Age of Onset  . Hypertension Father      Social History Wyatt Santiago reports that he has been smoking cigarettes. He started smoking about 48 years ago. He has a 80.00 pack-year smoking history. He has never used smokeless tobacco. Wyatt Santiago reports current alcohol use of about 63.0 standard drinks of alcohol per week.   Review of Systems CONSTITUTIONAL: No weight loss, fever, chills, weakness or fatigue.  HEENT: Eyes: No visual loss, blurred vision, double vision or yellow sclerae.No hearing loss, sneezing, congestion, runny nose or sore throat.  SKIN: No rash or itching.  CARDIOVASCULAR: per hpi RESPIRATORY: No shortness of breath, cough or sputum.  GASTROINTESTINAL:  No anorexia, nausea, vomiting or diarrhea. No abdominal pain or blood.  GENITOURINARY: No burning on urination, no polyuria NEUROLOGICAL: No headache, dizziness, syncope, paralysis, ataxia, numbness or tingling in the extremities. No change in bowel or bladder control.  MUSCULOSKELETAL: No muscle, back pain, joint pain or stiffness.  LYMPHATICS: No enlarged nodes. No history of splenectomy.  PSYCHIATRIC: No history of depression or anxiety.  ENDOCRINOLOGIC: No reports of sweating, cold or heat intolerance. No polyuria or polydipsia.  Marland Kitchen   Physical Examination Today's Vitals   07/07/20 1055  BP: 120/70  Pulse: 74  SpO2: 99%  Weight: 193 lb (87.5 kg)  Height: 6' (1.829 m)   Body mass index is 26.18 kg/m.  Gen: resting comfortably, no acute distress HEENT: no scleral icterus, pupils equal round and reactive, no palptable cervical adenopathy,  CV: RRR, no mr/g, no jvd Resp: Clear to auscultation bilaterally GI: abdomen is soft, non-tender, non-distended, normal bowel sounds, no hepatosplenomegaly MSK: extremities are warm, no edema.  Skin: warm, no rash Neuro:  no focal deficits Psych: appropriate affect   Diagnostic Studies  07/2015 echo Study Conclusions  - Left ventricle: The cavity size was at the upper limits of normal. Wall thickness was normal. Systolic function was low normal. The estimated ejection fraction was in the range of 50% to 55%. Mild global hypokinesis. Diastolic dysfunction, indeterminate grade. - Aortic valve: Mildly calcified annulus. - Right atrium: The atrium was mildly dilated.  03/2015 cath 1. Mid LAD to Dist LAD lesion, 40% stenosed. 2. Mid RCA to Dist RCA lesion, 20% stenosed. 3. Ost 1st Mrg lesion, 40% stenosed. 4. Mid Cx-2 lesion, 50% stenosed. 5. Prox Cx lesion, 90% stenosed. There is a 0% residual stenosis post intervention. 6. A drug-eluting stent was placed.   Unstable angina pectoris due to high-grade obstruction in the  proximal circumflex with in a tortuous segment.  Successful PCI with Synergy drug-eluting stent implantation and reduction in 90% stenosis to 0% with TIMI grade 3 flow.  Mild residual first obtuse marginal and mid circumflex obstruction less than 50%. Less than 50% obstruction in the mid LAD.  Overall mildly reduced LV function with mid anterior wall hypokinesis and estimated ejection fraction 45%.    Post Cath RECOMMENDATIONS:   Dual antiplatelet therapy (should probably switch to clopidogrel and aspirin after one month). For the time being and should remain on Brilinta and aspirin.  Potentially able to discontinue gap therapy after 6 months using Synergy.  Potentially eligible for discharge in a.m. assuming no complications.  Ethanol abuse needs to be addressed with the patient and consideration of a plan for cessation instituted.  01/2018 nuclear stress test  Blood pressure demonstrated a hypertensive response to exercise.  Nonspecific ST changes with stress which resolved in recovery (leads II and III).  Defect 1: There is a medium defect  of mild severity present in the mid inferoseptal and mid inferior location. This appears to be due to soft tissue attenuation. There were no significant ischemic territories.  This is a low risk study.  Nuclear stress EF: 58%.   06/2020 echo IMPRESSIONS    1. Left ventricular ejection fraction, by estimation, is 55 to 60%. The  left ventricle has normal function. The left ventricle has no regional  wall motion abnormalities. Left ventricular diastolic parameters are  consistent with Grade I diastolic  dysfunction (impaired relaxation).  2. Right ventricular systolic function is normal. The right ventricular  size is normal. Tricuspid regurgitation signal is inadequate for assessing  PA pressure.  3. The mitral valve is normal in structure. No evidence of mitral valve  regurgitation. No evidence of mitral stenosis.  4. The  aortic valve is tricuspid. Aortic valve regurgitation is not  visualized. No aortic stenosis is present.  5. Aortic dilatation noted. There is mild dilatation of the ascending  aorta, measuring 38 mm.  6. The inferior vena cava is normal in size with greater than 50%  respiratory variability, suggesting right atrial pressure of 3 mmHg.    Assessment and Plan   1. CAD - recent stent as reported above, doing well since procedure - continue current meds includning DAPT. Considering oral surgery, would need to postopone at least 6 months with recent stent.   2.HTN - at goal, continue curren tmeds  3. Hyperlipidemia - at goal, cotninue statin  F/u 4 months Antoine Poche, M.D.

## 2020-07-30 ENCOUNTER — Encounter (HOSPITAL_COMMUNITY): Payer: Self-pay | Admitting: Cardiovascular Disease

## 2020-10-18 ENCOUNTER — Other Ambulatory Visit: Payer: Self-pay | Admitting: Cardiology

## 2020-11-16 ENCOUNTER — Telehealth: Payer: Self-pay | Admitting: Cardiology

## 2020-11-16 ENCOUNTER — Encounter: Payer: Self-pay | Admitting: Cardiology

## 2020-11-16 ENCOUNTER — Ambulatory Visit: Payer: PRIVATE HEALTH INSURANCE | Admitting: Cardiology

## 2020-11-16 VITALS — BP 160/90 | HR 66 | Ht 72.0 in | Wt 200.0 lb

## 2020-11-16 DIAGNOSIS — E782 Mixed hyperlipidemia: Secondary | ICD-10-CM | POA: Diagnosis not present

## 2020-11-16 DIAGNOSIS — I2510 Atherosclerotic heart disease of native coronary artery without angina pectoris: Secondary | ICD-10-CM | POA: Diagnosis not present

## 2020-11-16 DIAGNOSIS — I10 Essential (primary) hypertension: Secondary | ICD-10-CM

## 2020-11-16 NOTE — Telephone Encounter (Addendum)
Returned the patients wifes call. [email or web address] if assistance is still needed.

## 2020-11-16 NOTE — Patient Instructions (Signed)
Medication Instructions:   Your physician recommends that you continue on your current medications as directed. Please refer to the Current Medication list given to you today.  Labwork:  none  Testing/Procedures:  none  Follow-Up:  Your physician recommends that you schedule a follow-up appointment in: 6 months.  Any Other Special Instructions Will Be Listed Below (If Applicable).  Call office Friday with your blood pressure readings.  If you need a refill on your cardiac medications before your next appointment, please call your pharmacy.

## 2020-11-16 NOTE — Telephone Encounter (Signed)
Please call pt's wife Hiran Leard concerning pt cutting back on his caffeine.   [phone]

## 2020-11-16 NOTE — Telephone Encounter (Signed)
Spoke with the patients wife. Mrs. Thielke sts that the patient was seen today by Dr. Wyline Mood and the pt BP was elevated. He was instructed to reduce his caffeine intake, monitor his BP for a week and then call to report the readings.  Pt wife sts that the patient drinks 3-4 cups of coffee along with other caffeineated drinks daily. Pt wife ask if there is anything the patient could do to replace caffeine that would give him energy.  Adv her that he can try to limit his caffeine in take to just one cup a day, regular exercise, and he should try to get a good night sleep daily. He is to report his BP readings to Dr. Wyline Mood as planned.  Patient wife verbalized understanding and voiced appreciation for the assistance.Marland Kitchen

## 2020-11-16 NOTE — Progress Notes (Signed)
Clinical Summary Wyatt Santiago is a 60 y.o.male seen today for follow up of the following medical problems.  1. CAD - admit 03/2015 with chest pain. Received DES to LCX - 07/2015 echo LVEF 50-55%, abnormal diastolic function 01/2018 exercise nuclear stress test: exercised 8 min, no clear ischemia by imaging.   - admit 06/2020 with chest pain - -LHC performed 07/01/20 which showed patent proximal LCXstentand new severe stenosis midLCXjust beyond the old stentwith successful PTCA/DES x 1 mid Circumflex 06/2020 echo: LVEF 55-60%, no WMAs, grade I ddx,    - no recent chest pains. No SOB/DOE - compliant with meds    2. PSVT - occasional palpitations, rare.  - drinks 3-4 cups coffee daily  3. HTN -just took meds this AM  4. Hyperlipidemia  06/2020 TC 103 TG 78 HDL 36 LDL 51 - compliant with statin    Past Medical History:  Diagnosis Date  . Arthritis    "hands" (04/19/2015)  . Coronary artery disease    a. cath 04/19/15 1.  Mid LAD to Dist LAD lesion, 40% stenosed, S/p PCI and DES to prox LCx with LV EF of 45%.  . Hypertension   . Seizures (HCC)    "when I was a kid"  . Sleep apnea    "they say I've got it but I don't wear mask" (04/19/2015)  . SVT (supraventricular tachycardia) (HCC)   . Thrombocytopenia (HCC)      No Known Allergies   Current Outpatient Medications  Medication Sig Dispense Refill  . aspirin 81 MG chewable tablet Chew 1 tablet (81 mg total) by mouth daily.    Marland Kitchen atorvastatin (LIPITOR) 40 MG tablet TAKE 1 TABLET BY MOUTH EVERY DAY AT 6PM 15 tablet 0  . carvedilol (COREG) 3.125 MG tablet Take 1 tablet (3.125 mg total) by mouth 2 (two) times daily with a meal. 45 tablet 1  . clopidogrel (PLAVIX) 75 MG tablet Take 1 tablet (75 mg total) by mouth daily with breakfast. 30 tablet 11  . gabapentin (NEURONTIN) 300 MG capsule Take 300 mg by mouth daily.    Marland Kitchen lisinopril (ZESTRIL) 20 MG tablet TAKE 1 TABLET BY MOUTH EVERY DAY 90 tablet 3  .  nitroGLYCERIN (NITROSTAT) 0.4 MG SL tablet Place 1 tablet (0.4 mg total) under the tongue every 5 (five) minutes as needed for chest pain (up to 3 doses. If no relief after 3rd dose, proceed to the ED for an evaluation). 30 tablet 2   No current facility-administered medications for this visit.     Past Surgical History:  Procedure Laterality Date  . CARDIAC CATHETERIZATION N/A 04/19/2015   Procedure: Left Heart Cath and Coronary Angiography;  Surgeon: Lyn Records, MD;  Location: [facility] INVASIVE CV LAB;  Service: Cardiovascular;  Laterality: N/A;  . CARDIAC CATHETERIZATION N/A 04/19/2015   Procedure: Coronary Stent Intervention;  Surgeon: Lyn Records, MD;  Location: [facility] INVASIVE CV LAB;  Service: Cardiovascular;  Laterality: N/A;  . CORONARY ANGIOPLASTY WITH STENT PLACEMENT  04/19/2015   "1 stent"  . CORONARY STENT INTERVENTION N/A 07/01/2020   Procedure: CORONARY STENT INTERVENTION;  Surgeon: Kathleene Hazel, MD;  Location: [facility] INVASIVE CV LAB;  Service: Cardiovascular;  Laterality: N/A;  . EYE SURGERY Right    "lasered for macular degeneration"  . LEFT HEART CATH AND CORONARY ANGIOGRAPHY N/A 07/01/2020   Procedure: LEFT HEART CATH AND CORONARY ANGIOGRAPHY;  Surgeon: Kathleene Hazel, MD;  Location: [facility] INVASIVE CV LAB;  Service: Cardiovascular;  Laterality:  N/A;     No Known Allergies    Family History  Problem Relation Age of Onset  . Hypertension Father      Social History Wyatt Santiago reports that he has been smoking cigarettes. He started smoking about 48 years ago. He has a 80.00 pack-year smoking history. He has never used smokeless tobacco. Wyatt Santiago reports current alcohol use of about 63.0 standard drinks of alcohol per week.   Review of Systems CONSTITUTIONAL: No weight loss, fever, chills, weakness or fatigue.  HEENT: Eyes: No visual loss, blurred vision, double vision or yellow sclerae.No hearing loss, sneezing, congestion, runny nose or sore throat.   SKIN: No rash or itching.  CARDIOVASCULAR: per hpi RESPIRATORY: No shortness of breath, cough or sputum.  GASTROINTESTINAL: No anorexia, nausea, vomiting or diarrhea. No abdominal pain or blood.  GENITOURINARY: No burning on urination, no polyuria NEUROLOGICAL: No headache, dizziness, syncope, paralysis, ataxia, numbness or tingling in the extremities. No change in bowel or bladder control.  MUSCULOSKELETAL: No muscle, back pain, joint pain or stiffness.  LYMPHATICS: No enlarged nodes. No history of splenectomy.  PSYCHIATRIC: No history of depression or anxiety.  ENDOCRINOLOGIC: No reports of sweating, cold or heat intolerance. No polyuria or polydipsia.  Marland Kitchen   Physical Examination Today's Vitals   11/16/20 0839  BP: (!) 160/90  Pulse: 66  SpO2: 98%  Weight: 200 lb (90.7 kg)  Height: 6' (1.829 m)   Body mass index is 27.12 kg/m.  Gen: resting comfortably, no acute distress HEENT: no scleral icterus, pupils equal round and reactive, no palptable cervical adenopathy,  CV: RRR, no m/rg, no jvd Resp: Clear to auscultation bilaterally GI: abdomen is soft, non-tender, non-distended, normal bowel sounds, no hepatosplenomegaly MSK: extremities are warm, no edema.  Skin: warm, no rash Neuro:  no focal deficits Psych: appropriate affect   Diagnostic Studies 07/2015 echo Study Conclusions  - Left ventricle: The cavity size was at the upper limits of normal. Wall thickness was normal. Systolic function was low normal. The estimated ejection fraction was in the range of 50% to 55%. Mild global hypokinesis. Diastolic dysfunction, indeterminate grade. - Aortic valve: Mildly calcified annulus. - Right atrium: The atrium was mildly dilated.  03/2015 cath 1. Mid LAD to Dist LAD lesion, 40% stenosed. 2. Mid RCA to Dist RCA lesion, 20% stenosed. 3. Ost 1st Mrg lesion, 40% stenosed. 4. Mid Cx-2 lesion, 50% stenosed. 5. Prox Cx lesion, 90% stenosed. There is a 0% residual  stenosis post intervention. 6. A drug-eluting stent was placed.   Unstable angina pectoris due to high-grade obstruction in the proximal circumflex with in a tortuous segment.  Successful PCI with Synergy drug-eluting stent implantation and reduction in 90% stenosis to 0% with TIMI grade 3 flow.  Mild residual first obtuse marginal and mid circumflex obstruction less than 50%. Less than 50% obstruction in the mid LAD.  Overall mildly reduced LV function with mid anterior wall hypokinesis and estimated ejection fraction 45%.    Post Cath RECOMMENDATIONS:   Dual antiplatelet therapy (should probably switch to clopidogrel and aspirin after one month). For the time being and should remain on Brilinta and aspirin.  Potentially able to discontinue gap therapy after 6 months using Synergy.  Potentially eligible for discharge in a.m. assuming no complications.  Ethanol abuse needs to be addressed with the patient and consideration of a plan for cessation instituted.  01/2018 nuclear stress test  Blood pressure demonstrated a hypertensive response to exercise.  Nonspecific ST changes with stress  which resolved in recovery (leads II and III).  Defect 1: There is a medium defect of mild severity present in the mid inferoseptal and mid inferior location. This appears to be due to soft tissue attenuation. There were no significant ischemic territories.  This is a low risk study.  Nuclear stress EF: 58%.   06/2020 echo IMPRESSIONS    1. Left ventricular ejection fraction, by estimation, is 55 to 60%. The  left ventricle has normal function. The left ventricle has no regional  wall motion abnormalities. Left ventricular diastolic parameters are  consistent with Grade I diastolic  dysfunction (impaired relaxation).  2. Right ventricular systolic function is normal. The right ventricular  size is normal. Tricuspid regurgitation signal is inadequate for assessing  PA  pressure.  3. The mitral valve is normal in structure. No evidence of mitral valve  regurgitation. No evidence of mitral stenosis.  4. The aortic valve is tricuspid. Aortic valve regurgitation is not  visualized. No aortic stenosis is present.  5. Aortic dilatation noted. There is mild dilatation of the ascending  aorta, measuring 38 mm.  6. The inferior vena cava is normal in size with greater than 50%  respiratory variability, suggesting right atrial pressure of 3 mmHg.     Assessment and Plan  1. CAD - recent stent as reported above - no recent symptoms, continue current meds - he is considering oral surgery, discussed would be mid May would be earliest could consider holding plavix.   2.HTN Elevated but just took meds, he will call with home bp's on Friday     3. Hyperlipidemia -has been at goal, continue statin     Antoine Poche, M.D.

## 2020-11-19 ENCOUNTER — Telehealth: Payer: Self-pay | Admitting: Cardiology

## 2020-11-19 DIAGNOSIS — Z79899 Other long term (current) drug therapy: Secondary | ICD-10-CM

## 2020-11-19 MED ORDER — LISINOPRIL 40 MG PO TABS: 40.0000 mg | ORAL_TABLET | Freq: Every day | ORAL | 3 refills | Status: DC

## 2020-11-19 NOTE — Telephone Encounter (Signed)
Pt agreed to increase lisinopril to 40 mg and have labs done in 2 weeks.

## 2020-11-19 NOTE — Telephone Encounter (Signed)
Patient called in regards to giving BP readings. [phone]

## 2020-11-19 NOTE — Telephone Encounter (Signed)
Pt called to report BP's for the week.  3/29 166/87 3/30 167/95, 172/94 3/31 174/97,175/87 4/1   173/94

## 2020-11-19 NOTE — Telephone Encounter (Signed)
BP's are too high, increase lisinopril to 40mg  daily, check bmet in 2 weeks. Update on bp's in 2 weeks  [place] MD

## 2020-12-03 ENCOUNTER — Other Ambulatory Visit: Payer: Self-pay

## 2020-12-03 ENCOUNTER — Telehealth: Payer: Self-pay | Admitting: *Deleted

## 2020-12-03 ENCOUNTER — Other Ambulatory Visit (HOSPITAL_COMMUNITY)
Admission: RE | Admit: 2020-12-03 | Discharge: 2020-12-03 | Disposition: A | Discharge status: Home / Self Care | Payer: PRIVATE HEALTH INSURANCE | Source: Ambulatory Visit | Attending: Cardiology | Admitting: Cardiology

## 2020-12-03 DIAGNOSIS — Z79899 Other long term (current) drug therapy: Secondary | ICD-10-CM

## 2020-12-03 LAB — BASIC METABOLIC PANEL
Anion gap: 7 (ref 5–15)
BUN: 19 mg/dL (ref 8–23)
CO2: 26 mmol/L (ref 22–32)
Calcium: 8.9 mg/dL (ref 8.9–10.3)
Chloride: 104 mmol/L (ref 98–111)
Creatinine, Ser: 1.04 mg/dL (ref 0.61–1.24)
GFR, Estimated: >60 mL/min (ref >60)
Glucose, Bld: 84 mg/dL (ref 70–99)
Potassium: 4.4 mmol/L (ref 3.5–5.1)
Sodium: 137 mmol/L (ref 135–145)

## 2020-12-03 NOTE — Telephone Encounter (Signed)
Pt dropped of BP readings per phone note on increasing lisinopril 40 mg daily   4/4 BP 177/71 HR 98 4/5 BP 149/91 HR 91 4/6 BP 154/85 HR 90 4/7 VP 144/86 HR 86 4/8 BP 139/89 HR 86 4/9 BP 154/90 HR 89 4/10 BP 159/80 HR 91 4/11 BP 137/102 HR 92 4/12 BP 135/84 HR 84 4/13 BP 140/84 HR 80 4/14 BP 136/80 HR 80 4/15 BP 151/73 HR 93

## 2020-12-06 MED ORDER — CARVEDILOL 6.25 MG PO TABS: 6.2500 mg | ORAL_TABLET | Freq: Two times a day (BID) | ORAL | 1 refills | Status: DC

## 2020-12-06 NOTE — Telephone Encounter (Signed)
Pt voiced understanding and will update [place] in 2 weeks - updated medication list and pt says he would take Coreg 6.25 mg bid

## 2020-12-06 NOTE — Telephone Encounter (Signed)
Labs looked fine. Bp's still running too high, please increase coreg to 6.25mg  bid. Update [place] on bp's in 2 weeks, could just just 3-4 times a week if daily is inconvenient  Dominga Ferry MD

## 2020-12-20 ENCOUNTER — Telehealth: Payer: Self-pay | Admitting: *Deleted

## 2020-12-20 NOTE — Telephone Encounter (Signed)
Pt dropped of BP readings per phone note and increase of Coreg 6.25 mg bid   140/84 HR 81 147/76 HR 87 159/86 HR 87 148/81 HR 27 170/92 HR 90 174/89 HR 89 129/80 HR 86 136/86 HR 80 163/93 HR 80 147/92 HR 80 144/85 HR 80 149/79 HR 94

## 2020-12-21 ENCOUNTER — Telehealth: Payer: Self-pay | Admitting: *Deleted

## 2020-12-21 NOTE — Telephone Encounter (Signed)
   [facility] Pre-operative Risk Assessment    Patient Name: Wyatt Santiago  DOB: [birth year]  MRN: [id number]  .  Request for surgical clearance:  1. What type of surgery is being performed? MEDICAL CLEARANCE FOR SURGICAL EXTRACTIONS WITH LOCAL ANESTHETIC WITH EPINEPHRINE - ALSO IF ANTIBIOTIC PREMED IS INDICATED   2. When is this surgery scheduled? TBD   3. What type of clearance is required (medical clearance vs. Pharmacy clearance to hold med vs. Both)? BOTH  4. Are there any medications that need to be held prior to surgery and how long?    5. Practice name and name of physician performing surgery? [facility]    6. What is the office phone number? [phone]   7.   What is the office fax number? [phone]  8.   Anesthesia type (None, local, MAC, general) ? LOCAL WITH EPINEPHRINE    Mavi Un T 12/21/2020, 3:57 PM  _________________________________________________________________   (provider comments below)

## 2020-12-21 NOTE — Telephone Encounter (Signed)
Pt voiced understanding and will update [place] next week - updated medication list

## 2020-12-21 NOTE — Telephone Encounter (Signed)
Can you please ask them to clarify number of extractions?

## 2020-12-21 NOTE — Telephone Encounter (Signed)
BPs still runing too high, increase coreg to 12.5mg  bid and update [place] again next week  J Balraj Brayfield MD

## 2020-12-22 NOTE — Telephone Encounter (Signed)
S/w [place] at dental office and confirmed the pt will be having 6 teeth extracted. Dr. Yetta Flock is also needing to know if pt needs SBE. I will send note back to pre op pool for further advisement.

## 2020-12-22 NOTE — Telephone Encounter (Signed)
Dr. Wyline Mood Pt had PCI Nov 2021. In your last office note, mention of holding DAPT for dental procedures in mid-May. Can he hold ASA and plavix for extraction of 6 teeth? I do not see an indication for SBE PPX.   In addition, he has continued to have elevated BP. Dentist is requesting use of epinephrine. Should he hold off on dental procedure until BP is under good control?

## 2020-12-22 NOTE — Telephone Encounter (Signed)
   Name: Wyatt Santiago  DOB: 25-Jun-1961  MRN: [id number]   Primary Cardiologist: Dina Rich, MD  Chart reviewed as part of pre-operative protocol coverage. Pt had PCI Nov 2021. Per Dr. Wyline Mood, he may hold plavix for 5-7 days. He should continue ASA throughout without interruption. Epinephrine is fine in local anesthetic. He does not need SBE PPX from a cardiac perspective.   Therefore, based on ACC/AHA guidelines, the patient would be at acceptable risk for the planned procedure without further cardiovascular testing.   I will route this recommendation to the requesting party via Epic fax function and remove from pre-op pool. Please call with questions.  Roe Rutherford Tanga Gloor, PA 12/22/2020, 5:08 PM

## 2020-12-22 NOTE — Telephone Encounter (Signed)
Can hold plavix, would continue aspirin. Local epi is fine  Dominga Ferry MD

## 2020-12-22 NOTE — Telephone Encounter (Signed)
Additional note; pt is on Plavix and ASA

## 2021-01-06 ENCOUNTER — Telehealth: Payer: Self-pay | Admitting: *Deleted

## 2021-01-06 MED ORDER — CARVEDILOL 25 MG PO TABS: 25.0000 mg | ORAL_TABLET | Freq: Two times a day (BID) | ORAL | 1 refills | Status: DC

## 2021-01-06 NOTE — Telephone Encounter (Signed)
Pt dropped off BP readings per recent office appt with Dr Wyline Mood - dates were not listed   133/76 HR 74 148/80 HR 83 137/82 HR 79 159/93 HR 83 127/97 HR 82 149/88 HR 73 148/86 HR 81

## 2021-01-06 NOTE — Telephone Encounter (Signed)
Pt voiced understanding - updated medication list and pt will record BP and call us back 1 week after dose increase with readings

## 2021-01-06 NOTE — Telephone Encounter (Signed)
Bp's still too high, increase coreg to 25mg  bid. Update again 1 week please   [place] MD

## 2021-05-20 ENCOUNTER — Ambulatory Visit: Payer: No Typology Code available for payment source | Admitting: Cardiology

## 2021-05-20 ENCOUNTER — Encounter: Payer: Self-pay | Admitting: Cardiology

## 2021-05-20 ENCOUNTER — Other Ambulatory Visit: Payer: Self-pay

## 2021-05-20 VITALS — BP 128/72 | HR 68 | Ht 69.0 in | Wt 198.2 lb

## 2021-05-20 DIAGNOSIS — E782 Mixed hyperlipidemia: Secondary | ICD-10-CM | POA: Diagnosis not present

## 2021-05-20 DIAGNOSIS — I2510 Atherosclerotic heart disease of native coronary artery without angina pectoris: Secondary | ICD-10-CM

## 2021-05-20 DIAGNOSIS — I10 Essential (primary) hypertension: Secondary | ICD-10-CM

## 2021-05-20 MED ORDER — CLOPIDOGREL BISULFATE 75 MG PO TABS: 75.0000 mg | ORAL_TABLET | Freq: Every day | ORAL | Status: DC

## 2021-05-20 NOTE — Patient Instructions (Signed)
Medication Instructions:  Please stop your Plavix on July 01, 2021.  Continue all other medications.     Labwork: none  Testing/Procedures: none  Follow-Up: 6 months   Any Other Special Instructions Will Be Listed Below (If Applicable).   If you need a refill on your cardiac medications before your next appointment, please call your pharmacy.

## 2021-05-20 NOTE — Progress Notes (Signed)
Clinical Summary Wyatt Santiago is a 60 y.o.male seen today for follow up of the following medical problems.  1. CAD - admit 03/2015 with chest pain. Received DES to LCX - 07/2015 echo LVEF 50-55%, abnormal diastolic function  01/2018 exercise nuclear stress test: exercised 8 min, no clear ischemia by imaging.      - admit 06/2020 with chest pain - -LHC performed 07/01/20 which showed patent proximal LCX stent and new severe stenosis mid LCX just beyond the old stent with successful PTCA/DES x 1 mid Circumflex 06/2020 echo: LVEF 55-60%, no WMAs, grade I ddx,      No recent chest pains. NO SOB or DOE - compliant withmeds       2. PSVT - drinks 3-4 cups coffee daily  - infrequent palpitations    3. HTN - compliant with meds   4. Hyperlipidemia   06/2020 TC 103 TG 78 HDL 36 LDL 51 -he is compliant with statin.  - upcoming labs with pcp   Past Medical History:  Diagnosis Date   Arthritis    "hands" (04/19/2015)   Coronary artery disease    a. cath 04/19/15 1.  Mid LAD to Dist LAD lesion, 40% stenosed, S/p PCI and DES to prox LCx with LV EF of 45%.   Hypertension    Seizures (HCC)    "when I was a kid"   Sleep apnea    "they say I've got it but I don't wear mask" (04/19/2015)   SVT (supraventricular tachycardia) (HCC)    Thrombocytopenia (HCC)      No Known Allergies   Current Outpatient Medications  Medication Sig Dispense Refill   aspirin 81 MG chewable tablet Chew 1 tablet (81 mg total) by mouth daily.     atorvastatin (LIPITOR) 40 MG tablet TAKE 1 TABLET BY MOUTH EVERY DAY AT 6PM 15 tablet 0   carvedilol (COREG) 25 MG tablet Take 1 tablet (25 mg total) by mouth 2 (two) times daily. 180 tablet 1   clopidogrel (PLAVIX) 75 MG tablet Take 1 tablet (75 mg total) by mouth daily with breakfast. 30 tablet 11   gabapentin (NEURONTIN) 300 MG capsule Take 300 mg by mouth daily.     lisinopril (ZESTRIL) 40 MG tablet Take 1 tablet (40 mg total) by mouth daily. 90 tablet 3    nitroGLYCERIN (NITROSTAT) 0.4 MG SL tablet Place 1 tablet (0.4 mg total) under the tongue every 5 (five) minutes as needed for chest pain (up to 3 doses. If no relief after 3rd dose, proceed to the ED for an evaluation). 30 tablet 2   No current facility-administered medications for this visit.     Past Surgical History:  Procedure Laterality Date   CARDIAC CATHETERIZATION N/A 04/19/2015   Procedure: Left Heart Cath and Coronary Angiography;  Surgeon: Lyn Records, MD;  Location: [facility] INVASIVE CV LAB;  Service: Cardiovascular;  Laterality: N/A;   CARDIAC CATHETERIZATION N/A 04/19/2015   Procedure: Coronary Stent Intervention;  Surgeon: Lyn Records, MD;  Location: [facility] INVASIVE CV LAB;  Service: Cardiovascular;  Laterality: N/A;   CORONARY ANGIOPLASTY WITH STENT PLACEMENT  04/19/2015   "1 stent"   CORONARY STENT INTERVENTION N/A 07/01/2020   Procedure: CORONARY STENT INTERVENTION;  Surgeon: Kathleene Hazel, MD;  Location: [facility] INVASIVE CV LAB;  Service: Cardiovascular;  Laterality: N/A;   EYE SURGERY Right    "lasered for macular degeneration"   LEFT HEART CATH AND CORONARY ANGIOGRAPHY N/A 07/01/2020   Procedure: LEFT HEART  CATH AND CORONARY ANGIOGRAPHY;  Surgeon: Kathleene Hazel, MD;  Location: [facility] INVASIVE CV LAB;  Service: Cardiovascular;  Laterality: N/A;     No Known Allergies    Family History  Problem Relation Age of Onset   Hypertension Father      Social History Wyatt Santiago reports that he has been smoking cigarettes. He started smoking about 49 years ago. He has a 80.00 pack-year smoking history. He has never used smokeless tobacco. Wyatt Santiago reports current alcohol use of about 63.0 standard drinks per week.   Review of Systems CONSTITUTIONAL: No weight loss, fever, chills, weakness or fatigue.  HEENT: Eyes: No visual loss, blurred vision, double vision or yellow sclerae.No hearing loss, sneezing, congestion, runny nose or sore throat.  SKIN: No rash or  itching.  CARDIOVASCULAR: per hpi RESPIRATORY: No shortness of breath, cough or sputum.  GASTROINTESTINAL: No anorexia, nausea, vomiting or diarrhea. No abdominal pain or blood.  GENITOURINARY: No burning on urination, no polyuria NEUROLOGICAL: No headache, dizziness, syncope, paralysis, ataxia, numbness or tingling in the extremities. No change in bowel or bladder control.  MUSCULOSKELETAL: No muscle, back pain, joint pain or stiffness.  LYMPHATICS: No enlarged nodes. No history of splenectomy.  PSYCHIATRIC: No history of depression or anxiety.  ENDOCRINOLOGIC: No reports of sweating, cold or heat intolerance. No polyuria or polydipsia.  Marland Kitchen   Physical Examination Today's Vitals   05/20/21 1125  BP: 128/72  Pulse: 68  SpO2: 98%  Weight: 198 lb 3.2 oz (89.9 kg)  Height: 5\' 9"  (1.753 m)   Body mass index is 29.27 kg/m.  Gen: resting comfortably, no acute distress HEENT: no scleral icterus, pupils equal round and reactive, no palptable cervical adenopathy,  CV: RRR, no m/r/ gno jvd Resp: Clear to auscultation bilaterally GI: abdomen is soft, non-tender, non-distended, normal bowel sounds, no hepatosplenomegaly MSK: extremities are warm, no edema.  Skin: warm, no rash Neuro:  no focal deficits Psych: appropriate affect   Diagnostic Studies 07/2015 echo Study Conclusions  - Left ventricle: The cavity size was at the upper limits of   normal. Wall thickness was normal. Systolic function was low   normal. The estimated ejection fraction was in the range of 50%   to 55%. Mild global hypokinesis. Diastolic dysfunction,   indeterminate grade. - Aortic valve: Mildly calcified annulus. - Right atrium: The atrium was mildly dilated.   03/2015 cath Mid LAD to Dist LAD lesion, 40% stenosed. Mid RCA to Dist RCA lesion, 20% stenosed. Ost 1st Mrg lesion, 40% stenosed. Mid Cx-2 lesion, 50% stenosed. Prox Cx lesion, 90% stenosed. There is a 0% residual stenosis post intervention. A  drug-eluting stent was placed.   Unstable angina pectoris due to high-grade obstruction in the proximal circumflex with in a tortuous segment. Successful PCI with Synergy drug-eluting stent implantation and reduction in 90% stenosis to 0% with TIMI grade 3 flow. Mild residual first obtuse marginal and mid circumflex obstruction less than 50%. Less than 50% obstruction in the mid LAD. Overall mildly reduced LV function with mid anterior wall hypokinesis and estimated ejection fraction 45%.       Post Cath RECOMMENDATIONS:    Dual antiplatelet therapy (should probably switch to clopidogrel and aspirin after one month). For the time being and should remain on Brilinta and aspirin. Potentially able to discontinue gap therapy after 6 months using Synergy. Potentially eligible for discharge in a.m. assuming no complications. Ethanol abuse needs to be addressed with the patient and consideration of a plan for  cessation instituted.   01/2018 nuclear stress test Blood pressure demonstrated a hypertensive response to exercise. Nonspecific ST changes with stress which resolved in recovery (leads II and III). Defect 1: There is a medium defect of mild severity present in the mid inferoseptal and mid inferior location. This appears to be due to soft tissue attenuation. There were no significant ischemic territories. This is a low risk study. Nuclear stress EF: 58%.     06/2020 echo IMPRESSIONS     1. Left ventricular ejection fraction, by estimation, is 55 to 60%. The  left ventricle has normal function. The left ventricle has no regional  wall motion abnormalities. Left ventricular diastolic parameters are  consistent with Grade I diastolic  dysfunction (impaired relaxation).   2. Right ventricular systolic function is normal. The right ventricular  size is normal. Tricuspid regurgitation signal is inadequate for assessing  PA pressure.   3. The mitral valve is normal in structure. No evidence  of mitral valve  regurgitation. No evidence of mitral stenosis.   4. The aortic valve is tricuspid. Aortic valve regurgitation is not  visualized. No aortic stenosis is present.   5. Aortic dilatation noted. There is mild dilatation of the ascending  aorta, measuring 38 mm.   6. The inferior vena cava is normal in size with greater than 50%  respiratory variability, suggesting right atrial pressure of 3 mmHg.       Assessment and Plan  1. CAD - doing well without symptosm - can stop plavix 07/01/21, continue other meds   2.HTN - he is at goal, continue current meds     3. Hyperlipidemia LDL has been at goal, continue atrovastatin - labs followed by pcp   F/u 6 months      Antoine Poche, M.D

## 2021-06-05 ENCOUNTER — Other Ambulatory Visit: Payer: Self-pay | Admitting: Cardiology

## 2021-07-16 ENCOUNTER — Ambulatory Visit (INDEPENDENT_AMBULATORY_CARE_PROVIDER_SITE_OTHER): Payer: No Typology Code available for payment source

## 2021-07-16 ENCOUNTER — Ambulatory Visit
Admission: EM | Admit: 2021-07-16 | Discharge: 2021-07-16 | Disposition: A | Discharge status: Home / Self Care | Payer: No Typology Code available for payment source | Attending: Physician Assistant | Admitting: Physician Assistant

## 2021-07-16 DIAGNOSIS — W19XXXA Unspecified fall, initial encounter: Secondary | ICD-10-CM | POA: Diagnosis not present

## 2021-07-16 DIAGNOSIS — S63501A Unspecified sprain of right wrist, initial encounter: Secondary | ICD-10-CM | POA: Diagnosis not present

## 2021-07-16 DIAGNOSIS — M25531 Pain in right wrist: Secondary | ICD-10-CM | POA: Diagnosis not present

## 2021-07-16 NOTE — Discharge Instructions (Addendum)
Return if any problems.

## 2021-07-16 NOTE — ED Provider Notes (Signed)
[facility]    CSN: [id number] Arrival date & time: 07/16/21  1307      History   Chief Complaint Chief Complaint  Patient presents with   Wrist Injury    Pt states that he fell and injured his right wrist. X1 day    HPI Wyatt Santiago is a 60 y.o. male.   The history is provided by the patient. No language interpreter was used.  Wrist Injury Location:  Wrist Wrist location:  R wrist Injury: yes   Time since incident:  1 day Mechanism of injury: fall   Fall:    Point of impact:  Unable to specify Pain details:    Quality:  Aching   Radiates to:  Does not radiate   Severity:  Moderate   Onset quality:  Sudden Dislocation: no   Relieved by:  Nothing Worsened by:  Nothing  Past Medical History:  Diagnosis Date   Arthritis    "hands" (04/19/2015)   Coronary artery disease    a. cath 04/19/15 1.  Mid LAD to Dist LAD lesion, 40% stenosed, S/p PCI and DES to prox LCx with LV EF of 45%.   Hypertension    Seizures (HCC)    "when I was a kid"   Sleep apnea    "they say I've got it but I don't wear mask" (04/19/2015)   SVT (supraventricular tachycardia) (HCC)    Thrombocytopenia (HCC)     Patient Active Problem List   Diagnosis Date Noted   CAD (coronary artery disease), native coronary artery 07/01/2020   Chest pain, rule out acute myocardial infarction 06/30/2020   Unstable angina pectoris (HCC) 04/19/2015   Unstable angina (HCC) 04/19/2015   SVT (supraventricular tachycardia) (HCC) 12/01/2013   HTN (hypertension) 12/01/2013   Obstructive sleep apnea 12/01/2013   Alcohol abuse 12/01/2013   Cigarette smoker 12/01/2013    Past Surgical History:  Procedure Laterality Date   CARDIAC CATHETERIZATION N/A 04/19/2015   Procedure: Left Heart Cath and Coronary Angiography;  Surgeon: Lyn Records, MD;  Location: [facility] INVASIVE CV LAB;  Service: Cardiovascular;  Laterality: N/A;   CARDIAC CATHETERIZATION N/A 04/19/2015   Procedure: Coronary Stent Intervention;   Surgeon: Lyn Records, MD;  Location: [facility] INVASIVE CV LAB;  Service: Cardiovascular;  Laterality: N/A;   CORONARY ANGIOPLASTY WITH STENT PLACEMENT  04/19/2015   "1 stent"   CORONARY STENT INTERVENTION N/A 07/01/2020   Procedure: CORONARY STENT INTERVENTION;  Surgeon: Kathleene Hazel, MD;  Location: [facility] INVASIVE CV LAB;  Service: Cardiovascular;  Laterality: N/A;   EYE SURGERY Right    "lasered for macular degeneration"   LEFT HEART CATH AND CORONARY ANGIOGRAPHY N/A 07/01/2020   Procedure: LEFT HEART CATH AND CORONARY ANGIOGRAPHY;  Surgeon: Kathleene Hazel, MD;  Location: [facility] INVASIVE CV LAB;  Service: Cardiovascular;  Laterality: N/A;       Home Medications    Prior to Admission medications   Medication Sig Start Date End Date Taking? Authorizing Provider  aspirin 81 MG chewable tablet Chew 1 tablet (81 mg total) by mouth daily. 04/20/15  Yes Bhagat, Bhavinkumar, PA  atorvastatin (LIPITOR) 40 MG tablet TAKE 1 TABLET BY MOUTH EVERY DAY AT 6PM 06/20/17  Yes Branch, Dorothe Pea, MD  carvedilol (COREG) 25 MG tablet Take 25 mg by mouth 2 (two) times daily with a meal.   Yes [provider]  carvedilol (COREG) 6.25 MG tablet TAKE 1 TABLET BY MOUTH TWICE A DAY 06/06/21  Yes Antoine Poche,  MD  clopidogrel (PLAVIX) 75 MG tablet Take 1 tablet (75 mg total) by mouth daily with breakfast. (STOPPING ON 07/01/2021) 05/20/21 05/20/22 Yes Branch, Dorothe Pea, MD  gabapentin (NEURONTIN) 300 MG capsule Take 300 mg by mouth daily. 03/09/20  Yes [provider]  lisinopril (ZESTRIL) 40 MG tablet Take 40 mg by mouth daily.   Yes [provider]  nitroGLYCERIN (NITROSTAT) 0.4 MG SL tablet Place 1 tablet (0.4 mg total) under the tongue every 5 (five) minutes as needed for chest pain (up to 3 doses. If no relief after 3rd dose, proceed to the ED for an evaluation). 07/02/20  Yes Laverna Peace, MD    Family History Family History  Problem Relation Age of Onset    Hypertension Father     Social History Social History   Tobacco Use   Smoking status: Every Day    Packs/day: 1.00    Years: 40.00    Pack years: 40.00    Types: Cigarettes    Start date: 04/28/1972   Smokeless tobacco: Never  Substance Use Topics   Alcohol use: Yes    Alcohol/week: 63.0 standard drinks    Types: 63 Cans of beer per week    Comment: 04/19/2015 Drinks about a 8-9 beers/day.   Drug use: No     Allergies   Patient has no known allergies.   Review of Systems Review of Systems  All other systems reviewed and are negative.   Physical Exam Triage Vital Signs ED Triage Vitals [07/16/21 1555]  Enc Vitals Group     BP      Pulse      Resp      Temp      Temp src      SpO2      Weight 200 lb (90.7 kg)     Height 5\' 9"  (1.753 m)     Head Circumference      Peak Flow      Pain Score 3     Pain Loc      Pain Edu?      Excl. in GC?    No data found.  Updated Vital Signs Ht 5\' 9"  (1.753 m)   Wt 90.7 kg   BMI 29.53 kg/m   Visual Acuity Right Eye Distance:   Left Eye Distance:   Bilateral Distance:    Right Eye Near:   Left Eye Near:    Bilateral Near:     Physical Exam Vitals reviewed.  Cardiovascular:     Rate and Rhythm: Normal rate.  Pulmonary:     Effort: Pulmonary effort is normal.  Musculoskeletal:        General: Swelling and tenderness present.     Comments: Swollen tender right wrist,  decreased range of motion,  nv and ns intact   Skin:    General: Skin is warm.  Neurological:     General: No focal deficit present.     Mental Status: He is alert.  Psychiatric:        Mood and Affect: Mood normal.     UC Treatments / Results  Labs (all labs ordered are listed, but only abnormal results are displayed) Labs Reviewed - No data to display  EKG   Radiology No results found.  Procedures Procedures (including critical care time)  Medications Ordered in UC Medications - No data to display  Initial Impression /  Assessment and Plan / UC Course  I have reviewed the triage vital signs  and the nursing notes.  Pertinent labs & imaging results that were available during my care of the patient were reviewed by me and considered in my medical decision making (see chart for details).     MDM:  xray  no fracture,  Pt placed in thumb spica splint,  Pt advised to follow up with [facility] for evaluation  Final Clinical Impressions(s) / UC Diagnoses   Final diagnoses:  Sprain of right wrist, initial encounter   Discharge Instructions   None    ED Prescriptions   None    PDMP not reviewed this encounter. An After Visit Summary was printed and given to the patient.    Elson Areas, [place] 07/18/21 1756

## 2021-07-16 NOTE — ED Triage Notes (Signed)
Pt states that he fell and injured his right wrist. X1 day

## 2021-08-23 ENCOUNTER — Other Ambulatory Visit: Payer: Self-pay | Admitting: Cardiology

## 2021-10-07 ENCOUNTER — Telehealth: Payer: Self-pay | Admitting: Cardiology

## 2021-10-07 MED ORDER — ATORVASTATIN CALCIUM 40 MG PO TABS: 40.0000 mg | ORAL_TABLET | Freq: Every day | ORAL | 6 refills | Status: DC

## 2021-10-07 NOTE — Telephone Encounter (Signed)
Done

## 2021-10-07 NOTE — Telephone Encounter (Signed)
°*  STAT* If patient is at the pharmacy, call can be transferred to refill team.   1. Which medications need to be refilled? (please list name of each medication and dose if known) atorvastatin (LIPITOR) 40 MG tablet  2. Which pharmacy/location (including street and city if local pharmacy) is medication to be sent to?  [id number] - [place], [place] [address]   Phone: [phone] Fax: [phone]   3. Do they need a 30 day or 90 day supply? 30 ds

## 2021-12-05 ENCOUNTER — Other Ambulatory Visit: Payer: Self-pay | Admitting: Cardiology

## 2021-12-16 ENCOUNTER — Encounter: Payer: Self-pay | Admitting: Cardiology

## 2021-12-16 ENCOUNTER — Ambulatory Visit: Payer: No Typology Code available for payment source | Admitting: Cardiology

## 2021-12-16 ENCOUNTER — Encounter: Payer: Self-pay | Admitting: *Deleted

## 2021-12-16 VITALS — BP 118/68 | HR 59 | Ht 72.0 in | Wt 202.8 lb

## 2021-12-16 DIAGNOSIS — I10 Essential (primary) hypertension: Secondary | ICD-10-CM

## 2021-12-16 DIAGNOSIS — I2510 Atherosclerotic heart disease of native coronary artery without angina pectoris: Secondary | ICD-10-CM

## 2021-12-16 DIAGNOSIS — E782 Mixed hyperlipidemia: Secondary | ICD-10-CM

## 2021-12-16 NOTE — Progress Notes (Signed)
? ? ? ?Clinical Summary ?Wyatt Santiago is a 61 y.o.male seen today for follow up of the following medical problems.  ? ? ?1. CAD ?- admit 03/2015 with chest pain. Received DES to LCX ?- 07/2015 echo LVEF 50-55%, abnormal diastolic function ? 01/2018 exercise nuclear stress test: exercised 8 min, no clear ischemia by imaging.  ?  ?  ?- admit 06/2020 with chest pain ?- -LHC performed 07/01/20 which showed patent proximal LCX stent and new severe stenosis mid LCX just beyond the old stent with successful PTCA/DES x 1 mid Circumflex ?06/2020 echo: LVEF 55-60%, no WMAs, grade I ddx,  ?  ?  ?- no chest pains, no SOB/DOE ?- compliant with meds ?  ?  ?  ?2. PSVT ?- drinks 3-4 cups coffee daily ?  ?- Rare symptoms since last visti  ?  ?  ?3. HTN ?- compliant with meds ?  ?4. Hyperlipidemia ?  ?06/2020 TC 103 TG 78 HDL 36 LDL 51 ?-labs followed by pcp, he is on atorvastatin 40mg  daily.  ? ? ? ? ?Works [employer] in [place] ?  ?Past Medical History:  ?Diagnosis Date  ? Arthritis   ? "hands" (04/19/2015)  ? Coronary artery disease   ? a. cath 04/19/15 1.  Mid LAD to Dist LAD lesion, 40% stenosed, S/p PCI and DES to prox LCx with LV EF of 45%.  ? Hypertension   ? Seizures (HCC)   ? "when I was a kid"  ? Sleep apnea   ? "they say I've got it but I don't wear mask" (04/19/2015)  ? SVT (supraventricular tachycardia) (HCC)   ? Thrombocytopenia (HCC)   ? ? ? ?No Known Allergies ? ? ?Current Outpatient Medications on File Prior to Visit  ?Medication Sig Dispense Refill  ? aspirin 81 MG chewable tablet Chew 1 tablet (81 mg total) by mouth daily.    ? atorvastatin (LIPITOR) 40 MG tablet Take 1 tablet (40 mg total) by mouth daily. 30 tablet 6  ? carvedilol (COREG) 25 MG tablet TAKE 1 TABLET BY MOUTH TWICE DAILY 180 tablet 3  ? lisinopril (ZESTRIL) 40 MG tablet TAKE 1 TABLET BY MOUTH EVERY DAY 90 tablet 1  ? Misc Natural Products (PROSTATE HEALTH PO) Take 1 tablet by mouth daily.    ? Multiple Vitamin (MULTIVITAMIN) tablet  Take 1 tablet by mouth daily.    ? nitroGLYCERIN (NITROSTAT) 0.4 MG SL tablet Place 1 tablet (0.4 mg total) under the tongue every 5 (five) minutes as needed for chest pain (up to 3 doses. If no relief after 3rd dose, proceed to the ED for an evaluation). 30 tablet 2  ? ?No current facility-administered medications on file prior to visit.  ? ? ? ? ? ?Past Surgical History:  ?Procedure Laterality Date  ? CARDIAC CATHETERIZATION N/A 04/19/2015  ? Procedure: Left Heart Cath and Coronary Angiography;  Surgeon: 04/21/2015, MD;  Location: [facility] INVASIVE CV LAB;  Service: Cardiovascular;  Laterality: N/A;  ? CARDIAC CATHETERIZATION N/A 04/19/2015  ? Procedure: Coronary Stent Intervention;  Surgeon: 04/21/2015, MD;  Location: [facility] INVASIVE CV LAB;  Service: Cardiovascular;  Laterality: N/A;  ? CORONARY ANGIOPLASTY WITH STENT PLACEMENT  04/19/2015  ? "1 stent"  ? CORONARY STENT INTERVENTION N/A 07/01/2020  ? Procedure: CORONARY STENT INTERVENTION;  Surgeon: 13/06/2020, MD;  Location: [facility] INVASIVE CV LAB;  Service: Cardiovascular;  Laterality: N/A;  ? EYE SURGERY Right   ? "lasered for macular degeneration"  ?  LEFT HEART CATH AND CORONARY ANGIOGRAPHY N/A 07/01/2020  ? Procedure: LEFT HEART CATH AND CORONARY ANGIOGRAPHY;  Surgeon: Kathleene Hazel, MD;  Location: [facility] INVASIVE CV LAB;  Service: Cardiovascular;  Laterality: N/A;  ? ? ? ?No Known Allergies ? ? ? ?Family History  ?Problem Relation Age of Onset  ? Hypertension Father   ? ? ? ?Social History ?Mr. Schoffstall reports that he has been smoking cigarettes. He started smoking about 49 years ago. He has a 40.00 pack-year smoking history. He has never used smokeless tobacco. ?Mr. Pekala reports current alcohol use of about 63.0 standard drinks per week. ? ? ?Review of Systems ?CONSTITUTIONAL: No weight loss, fever, chills, weakness or fatigue.  ?HEENT: Eyes: No visual loss, blurred vision, double vision or yellow sclerae.No hearing loss, sneezing, congestion,  runny nose or sore throat.  ?SKIN: No rash or itching.  ?CARDIOVASCULAR: per hpi ?RESPIRATORY: No shortness of breath, cough or sputum.  ?GASTROINTESTINAL: No anorexia, nausea, vomiting or diarrhea. No abdominal pain or blood.  ?GENITOURINARY: No burning on urination, no polyuria ?NEUROLOGICAL: No headache, dizziness, syncope, paralysis, ataxia, numbness or tingling in the extremities. No change in bowel or bladder control.  ?MUSCULOSKELETAL: No muscle, back pain, joint pain or stiffness.  ?LYMPHATICS: No enlarged nodes. No history of splenectomy.  ?PSYCHIATRIC: No history of depression or anxiety.  ?ENDOCRINOLOGIC: No reports of sweating, cold or heat intolerance. No polyuria or polydipsia.  ?. ? ? ?Physical Examination ?Today's Vitals  ? 12/16/21 0914  ?BP: 118/68  ?Pulse: (!) 59  ?SpO2: 97%  ?Weight: 202 lb 12.8 oz (92 kg)  ?Height: 6' (1.829 m)  ? ?Body mass index is 27.5 kg/m?. ? ?Gen: resting comfortably, no acute distress ?HEENT: no scleral icterus, pupils equal round and reactive, no palptable cervical adenopathy,  ?CV: RRR, no mr/g no jvd ?Resp: Clear to auscultation bilaterally ?GI: abdomen is soft, non-tender, non-distended, normal bowel sounds, no hepatosplenomegaly ?MSK: extremities are warm, no edema.  ?Skin: warm, no rash ?Neuro:  no focal deficits ?Psych: appropriate affect ? ? ?Diagnostic Studies ? ?07/2015 echo ?Study Conclusions ? ?- Left ventricle: The cavity size was at the upper limits of ?  normal. Wall thickness was normal. Systolic function was low ?  normal. The estimated ejection fraction was in the range of 50% ?  to 55%. Mild global hypokinesis. Diastolic dysfunction, ?  indeterminate grade. ?- Aortic valve: Mildly calcified annulus. ?- Right atrium: The atrium was mildly dilated. ?  ?03/2015 cath ?Mid LAD to Dist LAD lesion, 40% stenosed. ?Mid RCA to Dist RCA lesion, 20% stenosed. ?Ost 1st Mrg lesion, 40% stenosed. ?Mid Cx-2 lesion, 50% stenosed. ?Prox Cx lesion, 90% stenosed. There is a  0% residual stenosis post intervention. ?A drug-eluting stent was placed. ?  ?Unstable angina pectoris due to high-grade obstruction in the proximal circumflex with in a tortuous segment. ?Successful PCI with Synergy drug-eluting stent implantation and reduction in 90% stenosis to 0% with TIMI grade 3 flow. ?Mild residual first obtuse marginal and mid circumflex obstruction less than 50%. Less than 50% obstruction in the mid LAD. ?Overall mildly reduced LV function with mid anterior wall hypokinesis and estimated ejection fraction 45%. ?  ?  ?  ?Post Cath RECOMMENDATIONS: ?   ?Dual antiplatelet therapy (should probably switch to clopidogrel and aspirin after one month). For the time being and should remain on Brilinta and aspirin. ?Potentially able to discontinue gap therapy after 6 months using Synergy. ?Potentially eligible for discharge in a.m. assuming no complications. ?Ethanol  abuse needs to be addressed with the patient and consideration of a plan for cessation instituted. ?  ?01/2018 nuclear stress test ?Blood pressure demonstrated a hypertensive response to exercise. ?Nonspecific ST changes with stress which resolved in recovery (leads II and III). ?Defect 1: There is a medium defect of mild severity present in the mid inferoseptal and mid inferior location. This appears to be due to soft tissue attenuation. There were no significant ischemic territories. ?This is a low risk study. ?Nuclear stress EF: 58%. ?  ?  ?06/2020 echo ?IMPRESSIONS  ? ? ? 1. Left ventricular ejection fraction, by estimation, is 55 to 60%. The  ?left ventricle has normal function. The left ventricle has no regional  ?wall motion abnormalities. Left ventricular diastolic parameters are  ?consistent with Grade I diastolic  ?dysfunction (impaired relaxation).  ? 2. Right ventricular systolic function is normal. The right ventricular  ?size is normal. Tricuspid regurgitation signal is inadequate for assessing  ?PA pressure.  ? 3. The  mitral valve is normal in structure. No evidence of mitral valve  ?regurgitation. No evidence of mitral stenosis.  ? 4. The aortic valve is tricuspid. Aortic valve regurgitation is not  ?visualized. No aortic

## 2021-12-16 NOTE — Patient Instructions (Signed)
Medication Instructions:  Continue all current medications.   Labwork: none  Testing/Procedures: none  Follow-Up: 6 months   Any Other Special Instructions Will Be Listed Below (If Applicable).   If you need a refill on your cardiac medications before your next appointment, please call your pharmacy.  

## 2022-04-08 IMAGING — DX DG WRIST COMPLETE 3+V*R*
3 series · 3 of 3 positions shown · non-contrast
Comparison: None.

CLINICAL DATA: Fall.

EXAM:
RIGHT WRIST - COMPLETE 3+ VIEW

[wrist pa]
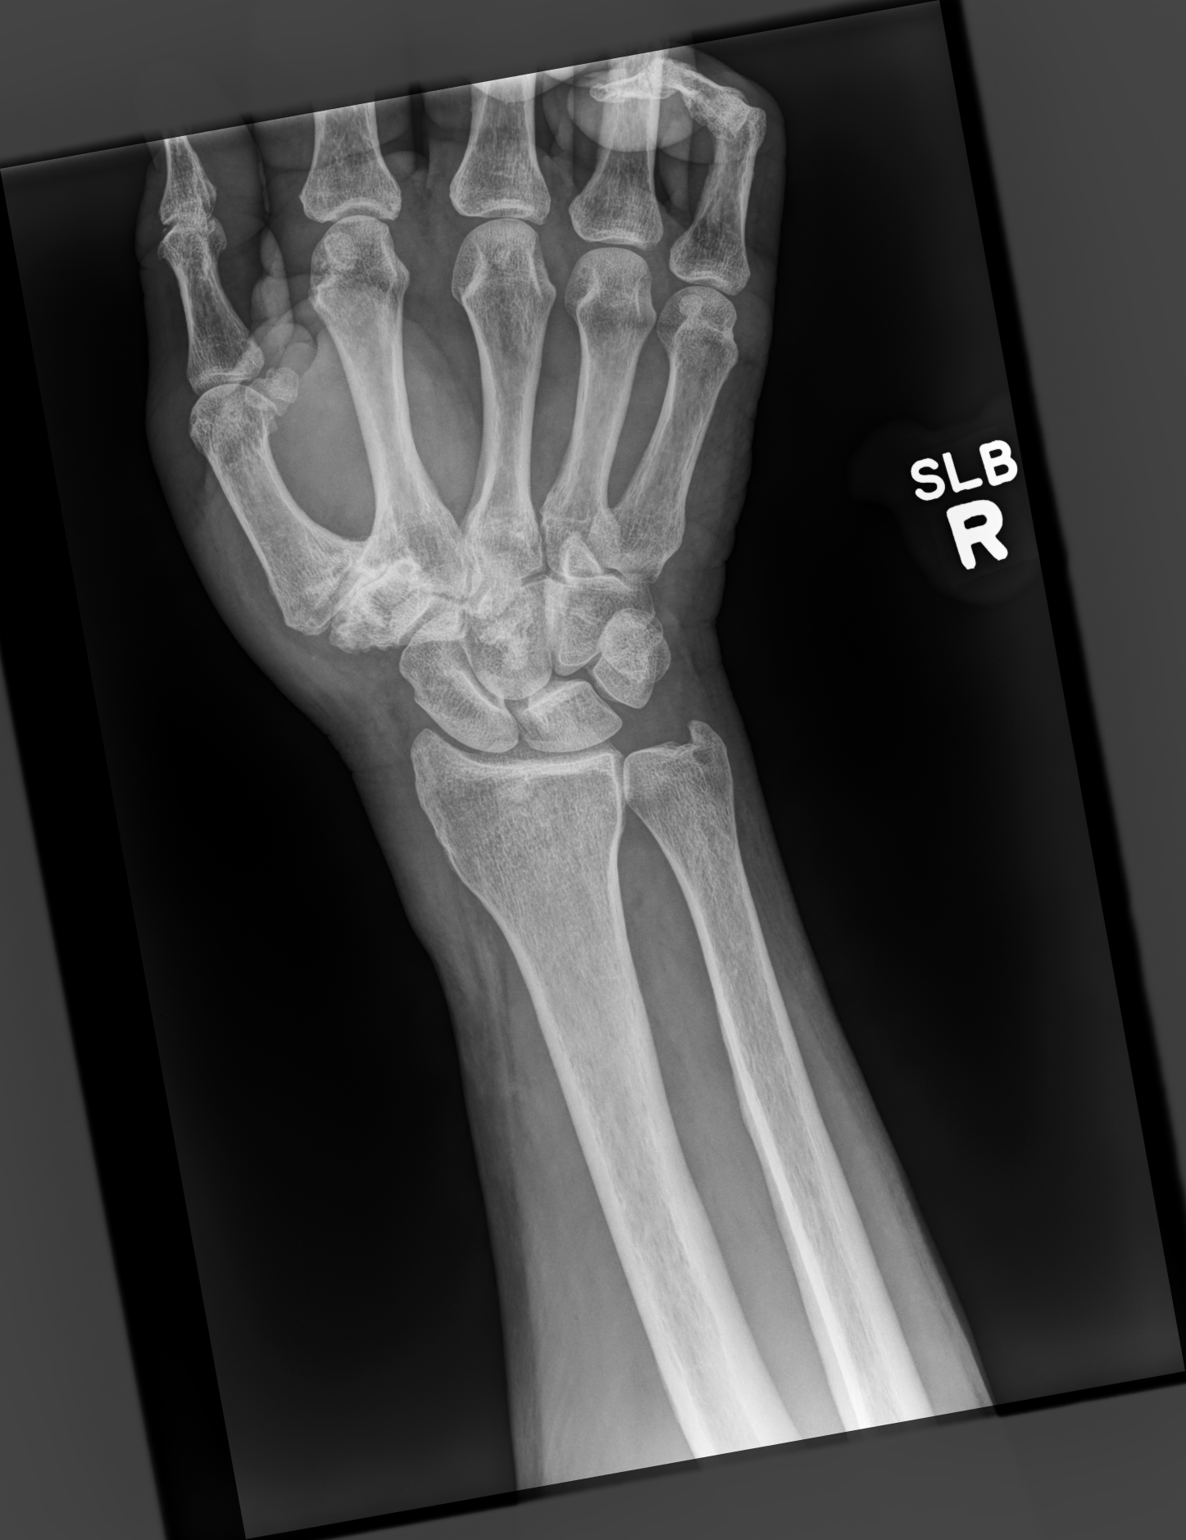

[wrist mlo]
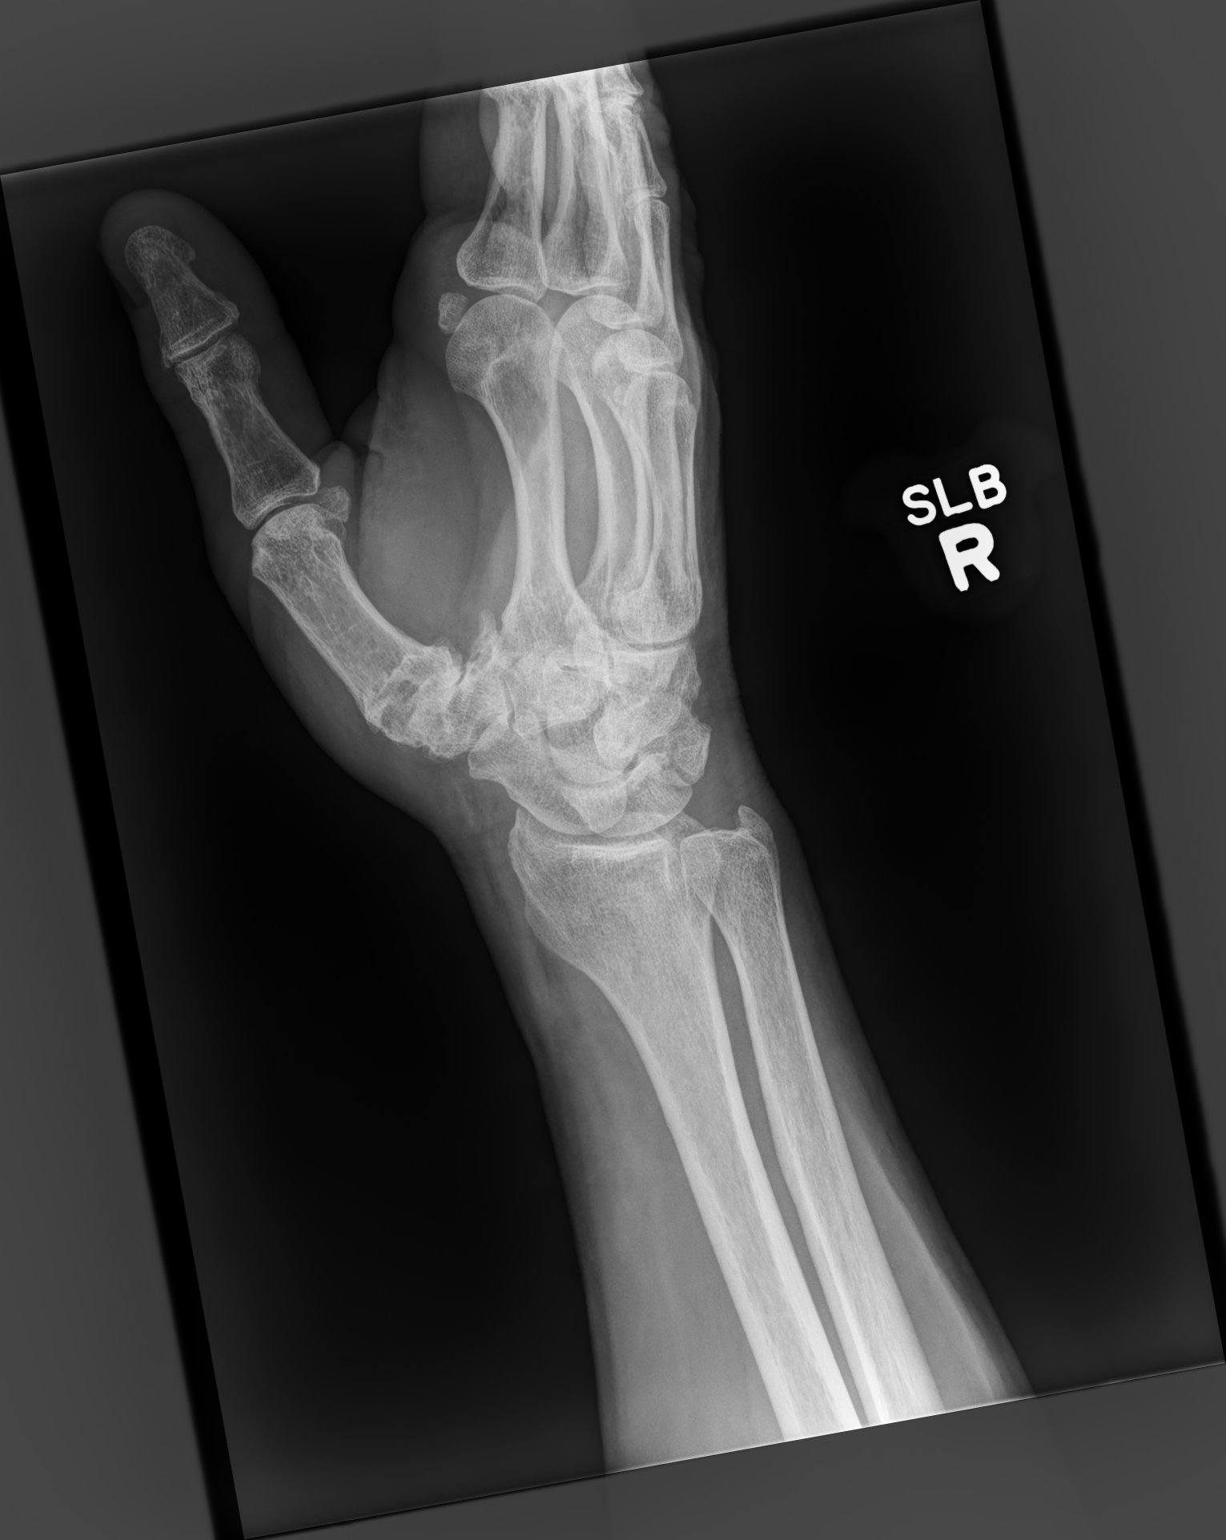

[wrist lat]
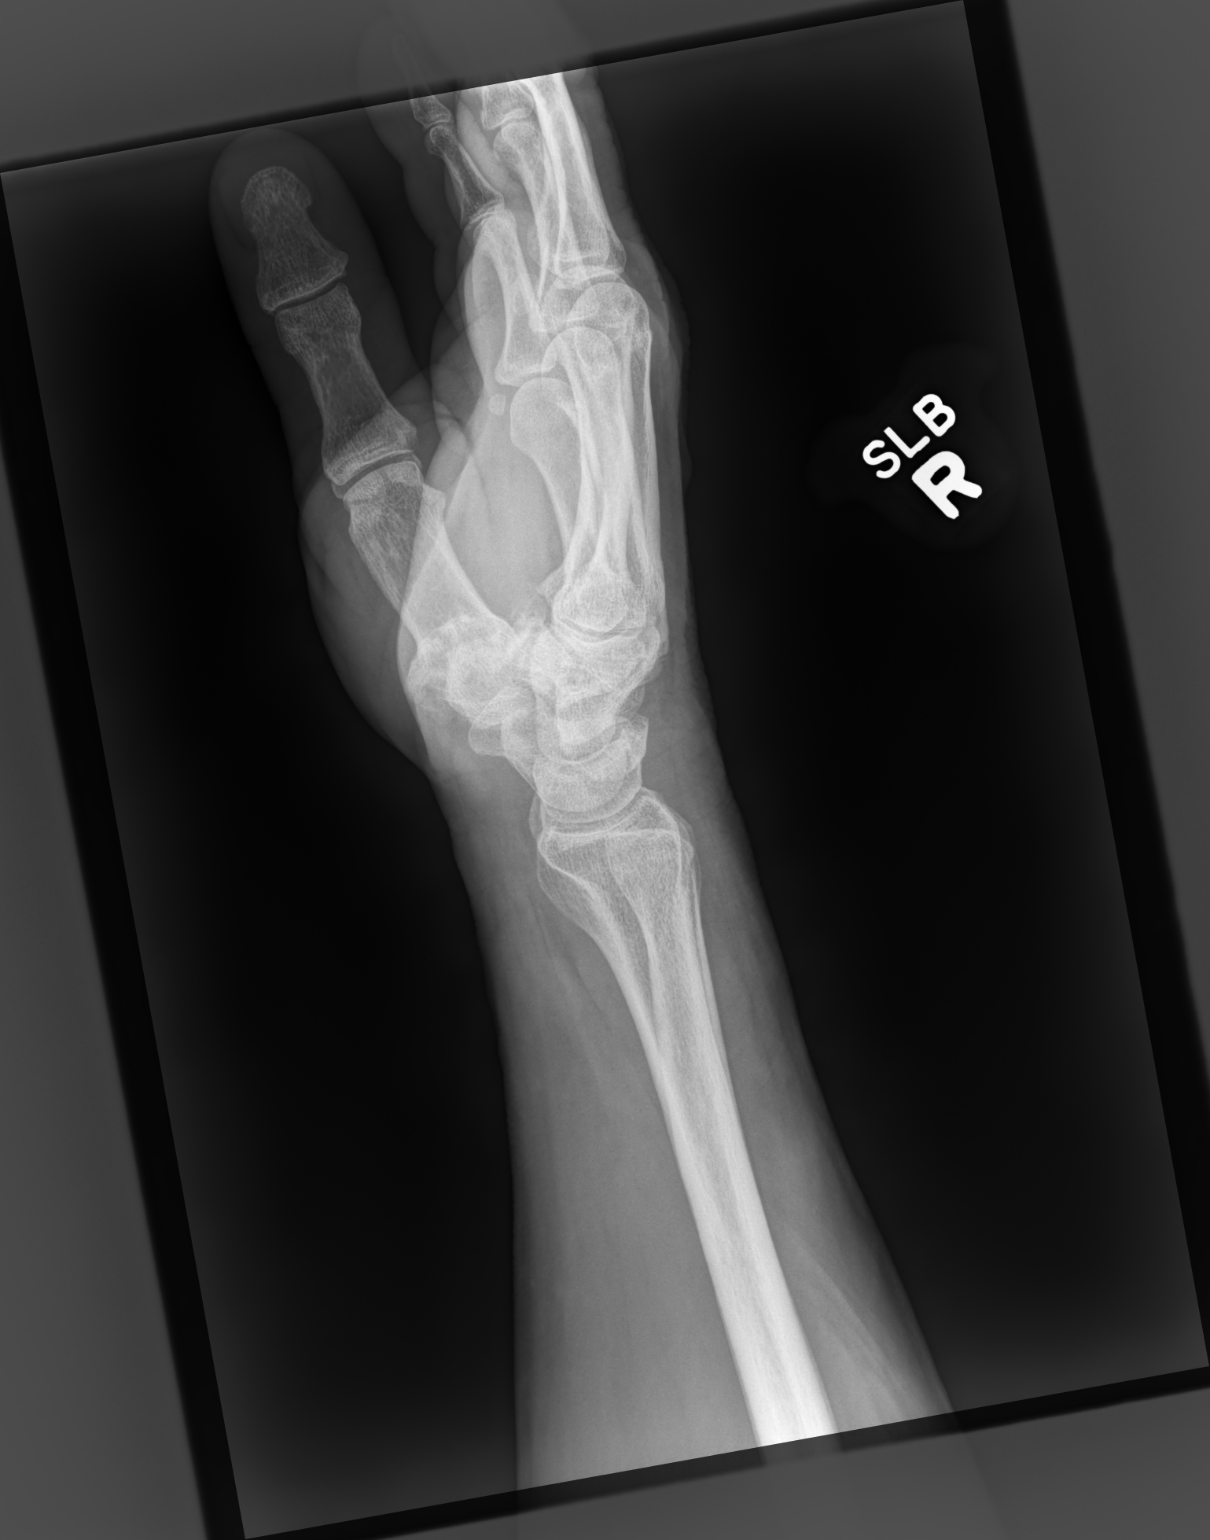

[3 of 3 positions shown; findings below may reference images not displayed]

FINDINGS: There is soft tissue swelling surrounding the wrist. There is no
acute fracture or dislocation identified. There are moderate
degenerative changes at the first carpometacarpal joint with joint
space narrowing and sclerosis.
IMPRESSION: No acute bony abnormality.

## 2022-05-17 ENCOUNTER — Other Ambulatory Visit: Payer: Self-pay | Admitting: Cardiology

## 2022-06-04 ENCOUNTER — Other Ambulatory Visit: Payer: Self-pay | Admitting: Cardiology

## 2022-06-27 ENCOUNTER — Ambulatory Visit: Payer: No Typology Code available for payment source | Attending: Cardiology | Admitting: Cardiology

## 2022-06-27 ENCOUNTER — Encounter: Payer: Self-pay | Admitting: Cardiology

## 2022-06-27 VITALS — BP 140/80 | HR 60 | Ht 72.0 in | Wt 203.4 lb

## 2022-06-27 DIAGNOSIS — Z79899 Other long term (current) drug therapy: Secondary | ICD-10-CM | POA: Diagnosis not present

## 2022-06-27 DIAGNOSIS — I10 Essential (primary) hypertension: Secondary | ICD-10-CM | POA: Diagnosis not present

## 2022-06-27 DIAGNOSIS — I471 Supraventricular tachycardia, unspecified: Secondary | ICD-10-CM | POA: Diagnosis not present

## 2022-06-27 DIAGNOSIS — I2510 Atherosclerotic heart disease of native coronary artery without angina pectoris: Secondary | ICD-10-CM | POA: Diagnosis not present

## 2022-06-27 NOTE — Patient Instructions (Signed)
Medication Instructions:  Your physician recommends that you continue on your current medications as directed. Please refer to the Current Medication list given to you today.   Labwork: CMET, Mag, CBC, TSH, HgA1C, Lipid (2 weeks @ [facility])  Testing/Procedures: none  Follow-Up:  Your physician recommends that you schedule a follow-up appointment in: 6 months  Any Other Special Instructions Will Be Listed Below (If Applicable).  On Friday, call the office or send MyChart message with your blood pressure readings.  If you need a refill on your cardiac medications before your next appointment, please call your pharmacy.

## 2022-06-27 NOTE — Progress Notes (Signed)
Clinical Summary Wyatt Santiago is a 61 y.o.male seen today for follow up of the following medical problems.      1. CAD - admit 03/2015 with chest pain. Received DES to LCX - 07/2015 echo LVEF 50-55%, abnormal diastolic function  08/4479 exercise nuclear stress test: exercised 8 min, no clear ischemia by imaging.      - admit 06/2020 with chest pain - -LHC performed 07/01/20 which showed patent proximal LCX stent and new severe stenosis mid LCX just beyond the old stent with successful PTCA/DES x 1 mid Circumflex 06/2020 echo: LVEF 55-60%, no WMAs, grade I ddx,      - no chest pains, no SOb/DOE - compliant with meds       2. PSVT   - no recent palpitations     3. HTN - he is compliant with meds   4. Hyperlipidemia   06/2020 TC 103 TG 78 HDL 36 LDL 51 -labs followed by pcp, he is on atorvastatin 40mg  daily.  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 cath 04/19/15 1.  Mid LAD to Dist LAD lesion, 40% stenosed, S/p PCI and DES to prox LCx with LV EF of 45%.   Hypertension    Seizures ([place])    "when I was a kid"   Sleep apnea    "they say I've got it but I don't wear mask" (04/19/2015)   SVT (supraventricular tachycardia) ([facility])    Thrombocytopenia ([facility])      No Known Allergies   Current Outpatient Medications  Medication Sig Dispense Refill   aspirin 81 MG chewable tablet Chew 1 tablet (81 mg total) by mouth daily.     atorvastatin (LIPITOR) 40 MG tablet TAKE 1 TABLET(40 MG) BY MOUTH DAILY 30 tablet 6   carvedilol (COREG) 25 MG tablet TAKE 1 TABLET BY MOUTH TWICE DAILY 180 tablet 3   lisinopril (ZESTRIL) 40 MG tablet TAKE 1 TABLET BY MOUTH EVERY DAY 90 tablet 1   Misc Natural Products (PROSTATE HEALTH PO) Take 1 tablet by mouth daily.     Multiple Vitamin (MULTIVITAMIN) tablet Take 1  tablet by mouth daily.     nitroGLYCERIN (NITROSTAT) 0.4 MG SL tablet Place 1 tablet (0.4 mg total) under the tongue every 5 (five) minutes as needed for chest pain (up to 3 doses. If no relief after 3rd dose, proceed to the ED for an evaluation). 30 tablet 2   No current facility-administered medications for this visit.     Past Surgical History:  Procedure Laterality Date   CARDIAC CATHETERIZATION N/A 04/19/2015   Procedure: Left Heart Cath and Coronary Angiography;  Surgeon: Belva Crome, MD;  Location: [facility];  Service: Cardiovascular;  Laterality: N/A;   CARDIAC CATHETERIZATION N/A 04/19/2015   Procedure: Coronary Stent Intervention;  Surgeon: Belva Crome, MD;  Location: [facility];  Service: Cardiovascular;  Laterality: N/A;   CORONARY ANGIOPLASTY WITH STENT PLACEMENT  04/19/2015   "1 stent"   CORONARY STENT INTERVENTION N/A 07/01/2020   Procedure: CORONARY STENT INTERVENTION;  Surgeon: Burnell Blanks, MD;  Location: [facility];  Service: Cardiovascular;  Laterality: N/A;   EYE SURGERY Right    "lasered for macular degeneration"   LEFT HEART CATH AND  CORONARY ANGIOGRAPHY N/A 07/01/2020   Procedure: LEFT HEART CATH AND CORONARY ANGIOGRAPHY;  Surgeon: Burnell Blanks, MD;  Location: [facility];  Service: Cardiovascular;  Laterality: N/A;     No Known Allergies    Family History  Problem Relation Age of Onset   Hypertension Father      Social History Wyatt Santiago reports that he has been smoking cigarettes. He started smoking about 50 years ago. He has a 40.00 pack-year smoking history. He has never used smokeless tobacco. Wyatt Santiago reports current alcohol use of about 63.0 standard drinks of alcohol per week.   Review of Systems CONSTITUTIONAL: No weight loss, fever, chills, weakness or fatigue.  HEENT: Eyes: No visual loss, blurred vision, double vision or yellow sclerae.No hearing loss, sneezing, congestion, runny nose  or sore throat.  SKIN: No rash or itching.  CARDIOVASCULAR: per hpi RESPIRATORY: No shortness of breath, cough or sputum.  GASTROINTESTINAL: No anorexia, nausea, vomiting or diarrhea. No abdominal pain or blood.  GENITOURINARY: No burning on urination, no polyuria NEUROLOGICAL: No headache, dizziness, syncope, paralysis, ataxia, numbness or tingling in the extremities. No change in bowel or bladder control.  MUSCULOSKELETAL: No muscle, back pain, joint pain or stiffness.  LYMPHATICS: No enlarged nodes. No history of splenectomy.  PSYCHIATRIC: No history of depression or anxiety.  ENDOCRINOLOGIC: No reports of sweating, cold or heat intolerance. No polyuria or polydipsia.  Marland Kitchen   Physical Examination Today's Vitals   06/27/22 0812  BP: (!) 150/82  Pulse: 60  SpO2: 98%  Weight: 203 lb 6.4 oz (92.3 kg)  Height: 6' (1.829 m)   Body mass index is 27.59 kg/m.  Gen: resting comfortably, no acute distress HEENT: no scleral icterus, pupils equal round and reactive, no palptable cervical adenopathy,  CV: RRR, no m/r/g no jvd Resp: Clear to auscultation bilaterally GI: abdomen is soft, non-tender, non-distended, normal bowel sounds, no hepatosplenomegaly MSK: extremities are warm, no edema.  Skin: warm, no rash Neuro:  no focal deficits Psych: appropriate affect   Diagnostic Studies  07/2015 echo Study Conclusions  - Left ventricle: The cavity size was at the upper limits of   normal. Wall thickness was normal. Systolic function was low   normal. The estimated ejection fraction was in the range of 50%   to 55%. Mild global hypokinesis. Diastolic dysfunction,   indeterminate grade. - Aortic valve: Mildly calcified annulus. - Right atrium: The atrium was mildly dilated.   03/2015 cath Mid LAD to Dist LAD lesion, 40% stenosed. Mid RCA to Dist RCA lesion, 20% stenosed. Ost 1st Mrg lesion, 40% stenosed. Mid Cx-2 lesion, 50% stenosed. Prox Cx lesion, 90% stenosed. There is a 0%  residual stenosis post intervention. A drug-eluting stent was placed.   Unstable angina pectoris due to high-grade obstruction in the proximal circumflex with in a tortuous segment. Successful PCI with Synergy drug-eluting stent implantation and reduction in 90% stenosis to 0% with TIMI grade 3 flow. Mild residual first obtuse marginal and mid circumflex obstruction less than 50%. Less than 50% obstruction in the mid LAD. Overall mildly reduced LV function with mid anterior wall hypokinesis and estimated ejection fraction 45%.       Post Cath RECOMMENDATIONS:    Dual antiplatelet therapy (should probably switch to clopidogrel and aspirin after one month). For the time being and should remain on Brilinta and aspirin. Potentially able to discontinue gap therapy after 6 months using Synergy. Potentially eligible for discharge in a.m. assuming no complications. Ethanol abuse needs  to be addressed with the patient and consideration of a plan for cessation instituted.   01/2018 nuclear stress test Blood pressure demonstrated a hypertensive response to exercise. Nonspecific ST changes with stress which resolved in recovery (leads II and III). Defect 1: There is a medium defect of mild severity present in the mid inferoseptal and mid inferior location. This appears to be due to soft tissue attenuation. There were no significant ischemic territories. This is a low risk study. Nuclear stress EF: 58%.     06/2020 echo IMPRESSIONS     1. Left ventricular ejection fraction, by estimation, is 55 to 60%. The  left ventricle has normal function. The left ventricle has no regional  wall motion abnormalities. Left ventricular diastolic parameters are  consistent with Grade I diastolic  dysfunction (impaired relaxation).   2. Right ventricular systolic function is normal. The right ventricular  size is normal. Tricuspid regurgitation signal is inadequate for assessing  PA pressure.   3. The mitral  valve is normal in structure. No evidence of mitral valve  regurgitation. No evidence of mitral stenosis.   4. The aortic valve is tricuspid. Aortic valve regurgitation is not  visualized. No aortic stenosis is present.   5. Aortic dilatation noted. There is mild dilatation of the ascending  aorta, measuring 38 mm.   6. The inferior vena cava is normal in size with greater than 50%  respiratory variability, suggesting right atrial pressure of 3 mmHg.      Assessment and Plan   1. CAD - no symptoms, continue current meds   2.HTN - elevated today, last several vistis has been at goal - he will update [place] on home bp's Friday, if above goal would add norvasc 5mg  daily.      3. Hyperlipidemia - continue statin, repeat labs.      Arnoldo Lenis, M.D.

## 2022-06-30 ENCOUNTER — Telehealth: Payer: Self-pay | Admitting: Cardiology

## 2022-06-30 NOTE — Telephone Encounter (Signed)
  Pt c/o BP issue: STAT if pt c/o blurred vision, one-sided weakness or slurred speech  1. What are your last 5 BP readings?  11.07 - 146/85 11.08 - 156/95 11.09 - 157/95 This morning - 158/105  2. Are you having any other symptoms (ex. Dizziness, headache, blurred vision, passed out)? No symptoms  3. What is your BP issue? Pt calling to provide his BP reading

## 2022-07-03 ENCOUNTER — Other Ambulatory Visit (HOSPITAL_COMMUNITY)
Admission: RE | Admit: 2022-07-03 | Discharge: 2022-07-03 | Disposition: A | Discharge status: Home / Self Care | Payer: No Typology Code available for payment source | Source: Ambulatory Visit | Attending: Cardiology | Admitting: Cardiology

## 2022-07-03 DIAGNOSIS — Z79899 Other long term (current) drug therapy: Secondary | ICD-10-CM | POA: Insufficient documentation

## 2022-07-03 DIAGNOSIS — I10 Essential (primary) hypertension: Secondary | ICD-10-CM | POA: Diagnosis present

## 2022-07-03 DIAGNOSIS — I2510 Atherosclerotic heart disease of native coronary artery without angina pectoris: Secondary | ICD-10-CM | POA: Diagnosis present

## 2022-07-03 DIAGNOSIS — I471 Supraventricular tachycardia, unspecified: Secondary | ICD-10-CM | POA: Diagnosis present

## 2022-07-03 LAB — COMPREHENSIVE METABOLIC PANEL
ALT: 20 U/L (ref 0–44)
AST: 18 U/L (ref 15–41)
Albumin: 4.1 g/dL (ref 3.5–5.0)
Alkaline Phosphatase: 95 U/L (ref 38–126)
Anion gap: 6 (ref 5–15)
BUN: 14 mg/dL (ref 8–23)
CO2: 27 mmol/L (ref 22–32)
Calcium: 9.2 mg/dL (ref 8.9–10.3)
Chloride: 107 mmol/L (ref 98–111)
Creatinine, Ser: 0.98 mg/dL (ref 0.61–1.24)
GFR, Estimated: >60 mL/min (ref >60)
Glucose, Bld: 103 mg/dL — ABNORMAL HIGH (ref 70–99)
Potassium: 4.2 mmol/L (ref 3.5–5.1)
Sodium: 140 mmol/L (ref 135–145)
Total Bilirubin: 0.8 mg/dL (ref 0.3–1.2)
Total Protein: 7.3 g/dL (ref 6.5–8.1)

## 2022-07-03 LAB — LIPID PANEL
Cholesterol: 147 mg/dL (ref 0–200)
HDL: 51 mg/dL (ref >40)
LDL Cholesterol: 77 mg/dL (ref 0–99)
Total CHOL/HDL Ratio: 2.9 RATIO
Triglycerides: 97 mg/dL (ref <150)
VLDL: 19 mg/dL (ref 0–40)

## 2022-07-03 LAB — CBC
HCT: 44.3 % (ref 39.0–52.0)
Hemoglobin: 14.5 g/dL (ref 13.0–17.0)
MCH: 30.1 pg (ref 26.0–34.0)
MCHC: 32.7 g/dL (ref 30.0–36.0)
MCV: 92.1 fL (ref 80.0–100.0)
Platelets: 104 10*3/uL — ABNORMAL LOW (ref 150–400)
RBC: 4.81 MIL/uL (ref 4.22–5.81)
RDW: 13.2 % (ref 11.5–15.5)
WBC: 4.2 10*3/uL (ref 4.0–10.5)
nRBC: 0 % (ref 0.0–0.2)

## 2022-07-03 LAB — MAGNESIUM: Magnesium: 2 mg/dL (ref 1.7–2.4)

## 2022-07-03 LAB — TSH: TSH: 2.047 u[IU]/mL (ref 0.350–4.500)

## 2022-07-03 LAB — HEMOGLOBIN A1C
Hgb A1c MFr Bld: 4.7 % — ABNORMAL LOW (ref 4.8–5.6)
Mean Plasma Glucose: 88.19 mg/dL

## 2022-07-03 MED ORDER — AMLODIPINE BESYLATE 5 MG PO TABS: 5.0000 mg | ORAL_TABLET | Freq: Every day | ORAL | 3 refills | Status: DC

## 2022-07-03 NOTE — Telephone Encounter (Signed)
Bp's too high, please start norvasc 5mg  daily and update on bp's in 1 week  J Sharmon Cheramie MD

## 2022-07-03 NOTE — Telephone Encounter (Signed)
Left a message for patient to call office back regarding recommendation from provider.  

## 2022-07-03 NOTE — Telephone Encounter (Signed)
Patient returned CMA's call. 

## 2022-07-03 NOTE — Telephone Encounter (Signed)
Patient notified and verbalized understanding. Patient had no other questions or concerns at this time.  

## 2022-07-06 ENCOUNTER — Encounter: Payer: Self-pay | Admitting: *Deleted

## 2022-07-06 ENCOUNTER — Other Ambulatory Visit: Payer: Self-pay | Admitting: *Deleted

## 2022-10-24 ENCOUNTER — Other Ambulatory Visit: Payer: Self-pay | Admitting: Cardiology

## 2022-12-06 ENCOUNTER — Other Ambulatory Visit: Payer: Self-pay | Admitting: Cardiology

## 2023-01-01 ENCOUNTER — Encounter: Payer: Self-pay | Admitting: Cardiology

## 2023-01-01 ENCOUNTER — Other Ambulatory Visit: Payer: Self-pay | Admitting: Cardiology

## 2023-01-01 ENCOUNTER — Ambulatory Visit: Payer: Commercial Managed Care - PPO | Attending: Cardiology | Admitting: Cardiology

## 2023-01-01 VITALS — BP 102/70 | HR 64 | Ht 72.0 in | Wt 201.4 lb

## 2023-01-01 DIAGNOSIS — I10 Essential (primary) hypertension: Secondary | ICD-10-CM | POA: Diagnosis not present

## 2023-01-01 DIAGNOSIS — E782 Mixed hyperlipidemia: Secondary | ICD-10-CM | POA: Diagnosis not present

## 2023-01-01 DIAGNOSIS — I2510 Atherosclerotic heart disease of native coronary artery without angina pectoris: Secondary | ICD-10-CM | POA: Diagnosis not present

## 2023-01-01 DIAGNOSIS — I471 Supraventricular tachycardia, unspecified: Secondary | ICD-10-CM | POA: Diagnosis not present

## 2023-01-01 MED ORDER — ATORVASTATIN CALCIUM 80 MG PO TABS: 80.0000 mg | ORAL_TABLET | Freq: Every day | ORAL | 6 refills | Status: DC

## 2023-01-01 NOTE — Patient Instructions (Signed)
Medication Instructions:  Increase Atorvastatin to 80mg daily  Continue all other medications.     Labwork: none  Testing/Procedures: none  Follow-Up: 6 months   Any Other Special Instructions Will Be Listed Below (If Applicable).   If you need a refill on your cardiac medications before your next appointment, please call your pharmacy.  

## 2023-01-01 NOTE — Progress Notes (Signed)
Clinical Summary Wyatt Santiago is a 62 y.o.male seen today for follow up of the following medical problems.      1. CAD - admit 03/2015 with chest pain. Received DES to LCX - 07/2015 echo LVEF 50-55%, abnormal diastolic function  01/2018 exercise nuclear stress test: exercised 8 min, no clear ischemia by imaging.      - admit 06/2020 with chest pain - -LHC performed 07/01/20 which showed patent proximal LCX stent and new severe stenosis mid LCX just beyond the old stent with successful PTCA/DES x 1 mid Circumflex 06/2020 echo: LVEF 55-60%, no WMAs, grade I ddx,      - nonspecific bilateral chest pains at rest, better with activity.  - compliant with meds       2. PSVT - no recent palpitations.      3. HTN - he is compliant with meds   4. Hyperlipidemia   06/2020 TC 103 TG 78 HDL 36 LDL 51 -labs followed by pcp, he is on atorvastatin 40mg  daily.  - overude for repeat labs   06/2022 TC 147 TG 97 HDL 51 LDL 77 - based on this panel atorva was increased from 40mg  daily to 80mg . Somewhat unclear if he is taking the higher dose, he believes he is.      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 cath 04/19/15 1.  Mid LAD to Dist LAD lesion, 40% stenosed, S/p PCI and DES to prox LCx with LV EF of 45%.   Hypertension    Seizures ([facility])    "when I was a kid"   Sleep apnea    "they say I've got it but I don't wear mask" (04/19/2015)   SVT (supraventricular tachycardia)    Thrombocytopenia ([facility])      No Known Allergies   Current Outpatient Medications  Medication Sig Dispense Refill   amLODipine (NORVASC) 5 MG tablet Take 1 tablet (5 mg total) by mouth daily. 90 tablet 3   aspirin 81 MG chewable tablet Chew 1 tablet (81 mg total) by mouth daily.     atorvastatin (LIPITOR) 40 MG  tablet TAKE 1 TABLET(40 MG) BY MOUTH DAILY 30 tablet 6   carvedilol (COREG) 25 MG tablet TAKE 1 TABLET BY MOUTH TWICE DAILY 180 tablet 0   lisinopril (ZESTRIL) 40 MG tablet TAKE 1 TABLET BY MOUTH EVERY DAY 90 tablet 1   Multiple Vitamin (MULTIVITAMIN) tablet Take 1 tablet by mouth daily.     nitroGLYCERIN (NITROSTAT) 0.4 MG SL tablet Place 1 tablet (0.4 mg total) under the tongue every 5 (five) minutes as needed for chest pain (up to 3 doses. If no relief after 3rd dose, proceed to the ED for an evaluation). 30 tablet 2   No current facility-administered medications for this visit.     Past Surgical History:  Procedure Laterality Date   CARDIAC CATHETERIZATION N/A 04/19/2015   Procedure: Left Heart Cath and Coronary Angiography;  Surgeon: Lyn Records, MD;  Location: [facility];  Service: Cardiovascular;  Laterality: N/A;   CARDIAC CATHETERIZATION N/A 04/19/2015   Procedure: Coronary Stent Intervention;  Surgeon: Lyn Records, MD;  Location: [facility];  Service: Cardiovascular;  Laterality: N/A;   CORONARY ANGIOPLASTY WITH STENT PLACEMENT  04/19/2015   "1 stent"   CORONARY STENT INTERVENTION N/A 07/01/2020   Procedure: CORONARY STENT INTERVENTION;  Surgeon: Kathleene Hazel, MD;  Location: [facility];  Service: Cardiovascular;  Laterality: N/A;   EYE SURGERY Right    "lasered for macular degeneration"   LEFT HEART CATH AND CORONARY ANGIOGRAPHY N/A 07/01/2020   Procedure: LEFT HEART CATH AND CORONARY ANGIOGRAPHY;  Surgeon: Kathleene Hazel, MD;  Location: [facility];  Service: Cardiovascular;  Laterality: N/A;     No Known Allergies    Family History  Problem Relation Age of Onset   Hypertension Father      Social History Wyatt Santiago reports that he quit smoking about 8 months ago. His smoking use included cigarettes. He started smoking about 50 years ago. He has a 40.00 pack-year smoking history. He has never used smokeless  tobacco. Wyatt Santiago reports current alcohol use of about 63.0 standard drinks of alcohol per week.   Review of Systems CONSTITUTIONAL: No weight loss, fever, chills, weakness or fatigue.  HEENT: Eyes: No visual loss, blurred vision, double vision or yellow sclerae.No hearing loss, sneezing, congestion, runny nose or sore throat.  SKIN: No rash or itching.  CARDIOVASCULAR: per hpi RESPIRATORY: No shortness of breath, cough or sputum.  GASTROINTESTINAL: No anorexia, nausea, vomiting or diarrhea. No abdominal pain or blood.  GENITOURINARY: No burning on urination, no polyuria NEUROLOGICAL: No headache, dizziness, syncope, paralysis, ataxia, numbness or tingling in the extremities. No change in bowel or bladder control.  MUSCULOSKELETAL: No muscle, back pain, joint pain or stiffness.  LYMPHATICS: No enlarged nodes. No history of splenectomy.  PSYCHIATRIC: No history of depression or anxiety.  ENDOCRINOLOGIC: No reports of sweating, cold or heat intolerance. No polyuria or polydipsia.  Marland Kitchen   Physical Examination Today's Vitals   01/01/23 0851  BP: 102/70  Pulse: 64  SpO2: 99%  Weight: 201 lb 6.4 oz (91.4 kg)  Height: 6' (1.829 m)   Body mass index is 27.31 kg/m.  Gen: resting comfortably, no acute distress HEENT: no scleral icterus, pupils equal round and reactive, no palptable cervical adenopathy,  CV: RRR, no mrg, no jvd Resp: Clear to auscultation bilaterally GI: abdomen is soft, non-tender, non-distended, normal bowel sounds, no hepatosplenomegaly MSK: extremities are warm, no edema.  Skin: warm, no rash Neuro:  no focal deficits Psych: appropriate affect   Diagnostic Studies  07/2015 echo Study Conclusions  - Left ventricle: The cavity size was at the upper limits of   normal. Wall thickness was normal. Systolic function was low   normal. The estimated ejection fraction was in the range of 50%   to 55%. Mild global hypokinesis. Diastolic dysfunction,   indeterminate  grade. - Aortic valve: Mildly calcified annulus. - Right atrium: The atrium was mildly dilated.   03/2015 cath Mid LAD to Dist LAD lesion, 40% stenosed. Mid RCA to Dist RCA lesion, 20% stenosed. Ost 1st Mrg lesion, 40% stenosed. Mid Cx-2 lesion, 50% stenosed. Prox Cx lesion, 90% stenosed. There is a 0% residual stenosis post intervention. A drug-eluting stent was placed.   Unstable angina pectoris due to high-grade obstruction in the proximal circumflex with in a tortuous segment. Successful PCI with Synergy drug-eluting stent implantation and reduction in 90% stenosis to 0% with TIMI grade 3 flow. Mild residual first obtuse marginal and mid circumflex obstruction less than 50%. Less than 50% obstruction in the mid LAD. Overall mildly reduced LV function with mid anterior wall hypokinesis and estimated ejection fraction  45%.       Post Cath RECOMMENDATIONS:    Dual antiplatelet therapy (should probably switch to clopidogrel and aspirin after one month). For the time being and should remain on Brilinta and aspirin. Potentially able to discontinue gap therapy after 6 months using Synergy. Potentially eligible for discharge in a.m. assuming no complications. Ethanol abuse needs to be addressed with the patient and consideration of a plan for cessation instituted.   01/2018 nuclear stress test Blood pressure demonstrated a hypertensive response to exercise. Nonspecific ST changes with stress which resolved in recovery (leads II and III). Defect 1: There is a medium defect of mild severity present in the mid inferoseptal and mid inferior location. This appears to be due to soft tissue attenuation. There were no significant ischemic territories. This is a low risk study. Nuclear stress EF: 58%.     06/2020 echo IMPRESSIONS     1. Left ventricular ejection fraction, by estimation, is 55 to 60%. The  left ventricle has normal function. The left ventricle has no regional  wall motion  abnormalities. Left ventricular diastolic parameters are  consistent with Grade I diastolic  dysfunction (impaired relaxation).   2. Right ventricular systolic function is normal. The right ventricular  size is normal. Tricuspid regurgitation signal is inadequate for assessing  PA pressure.   3. The mitral valve is normal in structure. No evidence of mitral valve  regurgitation. No evidence of mitral stenosis.   4. The aortic valve is tricuspid. Aortic valve regurgitation is not  visualized. No aortic stenosis is present.   5. Aortic dilatation noted. There is mild dilatation of the ascending  aorta, measuring 38 mm.   6. The inferior vena cava is normal in size with greater than 50%  respiratory variability, suggesting right atrial pressure of 3 mmHg.    Assessment and Plan   1. CAD - no recent symptoms, continue current meds - EKG SR, no ischemic changes   2.HTN - at goal, continue current meds   3. Hyperlipidemia - LDL was above goal last check, little unclear if he is taking the higher atorvastatin dose. Will chagne prescription to 80mg  daily  F/u 6 months   Antoine Poche, M.D.

## 2023-01-21 ENCOUNTER — Other Ambulatory Visit: Payer: Self-pay | Admitting: Cardiology

## 2023-06-10 ENCOUNTER — Other Ambulatory Visit: Payer: Self-pay | Admitting: Cardiology

## 2023-07-06 ENCOUNTER — Other Ambulatory Visit: Payer: Self-pay | Admitting: Cardiology

## 2023-07-11 ENCOUNTER — Telehealth: Payer: Self-pay | Admitting: Cardiology

## 2023-07-11 ENCOUNTER — Ambulatory Visit: Payer: Commercial Managed Care - PPO | Attending: Cardiology | Admitting: Cardiology

## 2023-07-11 ENCOUNTER — Encounter: Payer: Self-pay | Admitting: Cardiology

## 2023-07-11 VITALS — BP 120/70 | HR 56 | Ht 69.0 in | Wt 211.0 lb

## 2023-07-11 DIAGNOSIS — E782 Mixed hyperlipidemia: Secondary | ICD-10-CM | POA: Diagnosis not present

## 2023-07-11 DIAGNOSIS — I10 Essential (primary) hypertension: Secondary | ICD-10-CM

## 2023-07-11 DIAGNOSIS — I2510 Atherosclerotic heart disease of native coronary artery without angina pectoris: Secondary | ICD-10-CM | POA: Diagnosis not present

## 2023-07-11 DIAGNOSIS — I471 Supraventricular tachycardia, unspecified: Secondary | ICD-10-CM | POA: Diagnosis not present

## 2023-07-11 NOTE — Progress Notes (Signed)
Clinical Summary Mr. Eilertson is a 62 y.o.male seen today for follow up of the following medical problems.      1. CAD - admit 03/2015 with chest pain. Received DES to LCX - 07/2015 echo LVEF 50-55%, abnormal diastolic function  01/2018 exercise nuclear stress test: exercised 8 min, no clear ischemia by imaging.      - admit 06/2020 with chest pain - -LHC performed 07/01/20 which showed patent proximal LCX stent and new severe stenosis mid LCX just beyond the old stent with successful PTCA/DES x 1 mid Circumflex 06/2020 echo: LVEF 55-60%, no WMAs, grade I ddx   - no recent chest pains, no SOB/DOE - compliant with meds           2. PSVT - denies any palpitations.      3. HTN - he is compliant with meds   4. Hyperlipidemia 06/2022 TC 147 TG 97 HDL 51 LDL 77 - based on this panel atorva was increased from 40mg  daily to 80mg . 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 cath 04/19/15 1.  Mid LAD to Dist LAD lesion, 40% stenosed, S/p PCI and DES to prox LCx with LV EF of 45%.   Hypertension    Seizures (HCC)    "when I was a kid"   Sleep apnea    "they say I've got it but I don't wear mask" (04/19/2015)   SVT (supraventricular tachycardia)    Thrombocytopenia (HCC)      No Known Allergies   Current Outpatient Medications  Medication Sig Dispense Refill   amLODipine (NORVASC) 5 MG tablet TAKE 1 TABLET(5 MG) BY MOUTH DAILY 90 tablet 1   aspirin 81 MG chewable tablet Chew 1 tablet (81 mg total) by mouth daily.     atorvastatin (LIPITOR) 80 MG tablet Take 1 tablet (80 mg total) by mouth daily. 30 tablet 6   carvedilol (COREG) 25 MG tablet TAKE 1 TABLET BY MOUTH TWICE DAILY 180 tablet 1   lisinopril (ZESTRIL) 40 MG tablet TAKE 1 TABLET BY MOUTH EVERY  DAY 90 tablet 1   Multiple Vitamin (MULTIVITAMIN) tablet Take 1 tablet by mouth daily.     nitroGLYCERIN (NITROSTAT) 0.4 MG SL tablet Place 1 tablet (0.4 mg total) under the tongue every 5 (five) minutes as needed for chest pain (up to 3 doses. If no relief after 3rd dose, proceed to the ED for an evaluation). 30 tablet 2   No current facility-administered medications for this visit.     Past Surgical History:  Procedure Laterality Date   CARDIAC CATHETERIZATION N/A 04/19/2015   Procedure: Left Heart Cath and Coronary Angiography;  Surgeon: Lyn Records, MD;  Location: [facility] INVASIVE CV LAB;  Service: Cardiovascular;  Laterality: N/A;   CARDIAC CATHETERIZATION N/A 04/19/2015   Procedure: Coronary Stent Intervention;  Surgeon: Lyn Records, MD;  Location: [facility] INVASIVE CV LAB;  Service: Cardiovascular;  Laterality: N/A;   CORONARY ANGIOPLASTY WITH STENT PLACEMENT  04/19/2015   "1 stent"   CORONARY STENT INTERVENTION N/A 07/01/2020   Procedure: CORONARY STENT INTERVENTION;  Surgeon: Kathleene Hazel, MD;  Location: [facility] INVASIVE CV LAB;  Service: Cardiovascular;  Laterality: N/A;   EYE  SURGERY Right    "lasered for macular degeneration"   LEFT HEART CATH AND CORONARY ANGIOGRAPHY N/A 07/01/2020   Procedure: LEFT HEART CATH AND CORONARY ANGIOGRAPHY;  Surgeon: Kathleene Hazel, MD;  Location: [facility] INVASIVE CV LAB;  Service: Cardiovascular;  Laterality: N/A;     No Known Allergies    Family History  Problem Relation Age of Onset   Hypertension Father      Social History Mr. Migliore reports that he quit smoking about 14 months ago. His smoking use included cigarettes. He started smoking about 51 years ago. He has a 50 pack-year smoking history. He has never used smokeless tobacco. Mr. Character reports current alcohol use of about 63.0 standard drinks of alcohol per week.   Review of Systems CONSTITUTIONAL: No weight loss, fever, chills, weakness or fatigue.  HEENT: Eyes: No visual  loss, blurred vision, double vision or yellow sclerae.No hearing loss, sneezing, congestion, runny nose or sore throat.  SKIN: No rash or itching.  CARDIOVASCULAR: per hpi RESPIRATORY: No shortness of breath, cough or sputum.  GASTROINTESTINAL: No anorexia, nausea, vomiting or diarrhea. No abdominal pain or blood.  GENITOURINARY: No burning on urination, no polyuria NEUROLOGICAL: No headache, dizziness, syncope, paralysis, ataxia, numbness or tingling in the extremities. No change in bowel or bladder control.  MUSCULOSKELETAL: No muscle, back pain, joint pain or stiffness.  LYMPHATICS: No enlarged nodes. No history of splenectomy.  PSYCHIATRIC: No history of depression or anxiety.  ENDOCRINOLOGIC: No reports of sweating, cold or heat intolerance. No polyuria or polydipsia.  Marland Kitchen   Physical Examination Today's Vitals   07/11/23 0807  BP: 120/70  Pulse: (!) 56  SpO2: 98%  Weight: 211 lb (95.7 kg)  Height: 5\' 9"  (1.753 m)   Body mass index is 31.16 kg/m.  Gen: resting comfortably, no acute distress HEENT: no scleral icterus, pupils equal round and reactive, no palptable cervical adenopathy,  CV: RRR, no m/rg, no jvd Resp: Clear to auscultation bilaterally GI: abdomen is soft, non-tender, non-distended, normal bowel sounds, no hepatosplenomegaly MSK: extremities are warm, no edema.  Skin: warm, no rash Neuro:  no focal deficits Psych: appropriate affect   Diagnostic Studies  07/2015 echo Study Conclusions  - Left ventricle: The cavity size was at the upper limits of   normal. Wall thickness was normal. Systolic function was low   normal. The estimated ejection fraction was in the range of 50%   to 55%. Mild global hypokinesis. Diastolic dysfunction,   indeterminate grade. - Aortic valve: Mildly calcified annulus. - Right atrium: The atrium was mildly dilated.   03/2015 cath Mid LAD to Dist LAD lesion, 40% stenosed. Mid RCA to Dist RCA lesion, 20% stenosed. Ost 1st Mrg  lesion, 40% stenosed. Mid Cx-2 lesion, 50% stenosed. Prox Cx lesion, 90% stenosed. There is a 0% residual stenosis post intervention. A drug-eluting stent was placed.   Unstable angina pectoris due to high-grade obstruction in the proximal circumflex with in a tortuous segment. Successful PCI with Synergy drug-eluting stent implantation and reduction in 90% stenosis to 0% with TIMI grade 3 flow. Mild residual first obtuse marginal and mid circumflex obstruction less than 50%. Less than 50% obstruction in the mid LAD. Overall mildly reduced LV function with mid anterior wall hypokinesis and estimated ejection fraction 45%.       Post Cath RECOMMENDATIONS:    Dual antiplatelet therapy (should probably switch to clopidogrel and aspirin after one month). For the time being and should remain on Brilinta and aspirin. Potentially able  to discontinue gap therapy after 6 months using Synergy. Potentially eligible for discharge in a.m. assuming no complications. Ethanol abuse needs to be addressed with the patient and consideration of a plan for cessation instituted.   01/2018 nuclear stress test Blood pressure demonstrated a hypertensive response to exercise. Nonspecific ST changes with stress which resolved in recovery (leads II and III). Defect 1: There is a medium defect of mild severity present in the mid inferoseptal and mid inferior location. This appears to be due to soft tissue attenuation. There were no significant ischemic territories. This is a low risk study. Nuclear stress EF: 58%.     06/2020 echo IMPRESSIONS     1. Left ventricular ejection fraction, by estimation, is 55 to 60%. The  left ventricle has normal function. The left ventricle has no regional  wall motion abnormalities. Left ventricular diastolic parameters are  consistent with Grade I diastolic  dysfunction (impaired relaxation).   2. Right ventricular systolic function is normal. The right ventricular  size is  normal. Tricuspid regurgitation signal is inadequate for assessing  PA pressure.   3. The mitral valve is normal in structure. No evidence of mitral valve  regurgitation. No evidence of mitral stenosis.   4. The aortic valve is tricuspid. Aortic valve regurgitation is not  visualized. No aortic stenosis is present.   5. Aortic dilatation noted. There is mild dilatation of the ascending  aorta, measuring 38 mm.   6. The inferior vena cava is normal in size with greater than 50%  respiratory variability, suggesting right atrial pressure of 3 mmHg.        Assessment and Plan   1. CAD - no symptoms, continue current meds   2.HTN - his bp is at goal, continue current meds  3. Hyperlipidemia - recheck lipid panel. Goal LDL would be <55    4. PSVT - no symptoms, continue coreg   F/u 6 months. Obtain annual labs  Antoine Poche, M.D.

## 2023-07-11 NOTE — Patient Instructions (Signed)
Medication Instructions:  Your physician recommends that you continue on your current medications as directed. Please refer to the Current Medication list given to you today.   Labwork: Labs to be completed- CMET, Magnesium, TSH, CBC Fasting Lipids and Hemoglobin A1C [facility] in [place]  Testing/Procedures: None  Follow-Up: Your physician recommends that you schedule a follow-up appointment in: 6 months  Any Other Special Instructions Will Be Listed Below (If Applicable). Thank you for choosing [facility]!      If you need a refill on your cardiac medications before your next appointment, please call your pharmacy.

## 2023-07-11 NOTE — Telephone Encounter (Signed)
Left message for wife to call back.

## 2023-07-11 NOTE — Telephone Encounter (Signed)
Pt's wife calling to make provider aware that pt has been having night sweats as well as having sweating episodes at work to the point he has to sit down and gets nauseous. Pt is currently in the office now for appt. Wife would like a c/b regarding this matter before pt leaves office if possible. Please advise

## 2023-07-11 NOTE — Telephone Encounter (Signed)
Spoke with wife regarding message sent to Dr. Wyline Mood. Advised her that he needed to check with PCP as to husband did not mention these symptoms. Advised her he does have labs he needs to complete in the morning-fasting. If there was anything showing will let patient know. Verbalized understanding  Did not mention. Those symptoms would need to be addressed with pcp   Dominga Ferry MD

## 2023-07-13 LAB — COMPREHENSIVE METABOLIC PANEL
ALT: 15 [IU]/L (ref 0–44)
AST: 15 [IU]/L (ref 0–40)
Albumin: 4.1 g/dL (ref 3.9–4.9)
Alkaline Phosphatase: 116 [IU]/L (ref 44–121)
BUN/Creatinine Ratio: 16 (ref 10–24)
BUN: 15 mg/dL (ref 8–27)
Bilirubin Total: 0.4 mg/dL (ref 0.0–1.2)
CO2: 25 mmol/L (ref 20–29)
Calcium: 9.1 mg/dL (ref 8.6–10.2)
Chloride: 106 mmol/L (ref 96–106)
Creatinine, Ser: 0.92 mg/dL (ref 0.76–1.27)
Globulin, Total: 2.3 g/dL (ref 1.5–4.5)
Glucose: 126 mg/dL — ABNORMAL HIGH (ref 70–99)
Potassium: 4.5 mmol/L (ref 3.5–5.2)
Sodium: 142 mmol/L (ref 134–144)
Total Protein: 6.4 g/dL (ref 6.0–8.5)
eGFR: 93 mL/min/{1.73_m2} (ref >59)

## 2023-07-13 LAB — LIPID PANEL
Chol/HDL Ratio: 2.8 ratio (ref 0.0–5.0)
Cholesterol, Total: 113 mg/dL (ref 100–199)
HDL: 40 mg/dL (ref >39)
LDL Chol Calc (NIH): 61 mg/dL (ref 0–99)
Triglycerides: 54 mg/dL (ref 0–149)
VLDL Cholesterol Cal: 12 mg/dL (ref 5–40)

## 2023-07-13 LAB — CBC
Hematocrit: 40.1 % (ref 37.5–51.0)
Hemoglobin: 12.6 g/dL — ABNORMAL LOW (ref 13.0–17.7)
MCH: 28.6 pg (ref 26.6–33.0)
MCHC: 31.4 g/dL — ABNORMAL LOW (ref 31.5–35.7)
MCV: 91 fL (ref 79–97)
Platelets: 120 10*3/uL — ABNORMAL LOW (ref 150–450)
RBC: 4.4 x10E6/uL (ref 4.14–5.80)
RDW: 12.8 % (ref 11.6–15.4)
WBC: 3.5 10*3/uL (ref 3.4–10.8)

## 2023-07-13 LAB — HEMOGLOBIN A1C
Est. average glucose Bld gHb Est-mCnc: 111 mg/dL
Hgb A1c MFr Bld: 5.5 % (ref 4.8–5.6)

## 2023-07-13 LAB — MAGNESIUM: Magnesium: 2.2 mg/dL (ref 1.6–2.3)

## 2023-07-13 LAB — TSH: TSH: 1.92 u[IU]/mL (ref 0.450–4.500)

## 2023-08-02 ENCOUNTER — Other Ambulatory Visit: Payer: Self-pay | Admitting: Cardiology

## 2023-08-03 ENCOUNTER — Telehealth: Payer: Self-pay

## 2023-08-03 MED ORDER — ROSUVASTATIN CALCIUM 40 MG PO TABS: 40.0000 mg | ORAL_TABLET | Freq: Every day | ORAL | 3 refills | Status: DC

## 2023-08-03 NOTE — Telephone Encounter (Signed)
Spoke to pt who verbalized understanding. Pt agreeable with medication changes. PCP copied.

## 2023-08-03 NOTE — Telephone Encounter (Signed)
-----   Message from Dina Rich sent at 07/17/2023  3:22 PM EST ----- Labs look ok other than cholesterol mildly above goal. Please stop atorvastatin and start crestor 40mg  daily.   Dominga Ferry MD

## 2023-10-01 ENCOUNTER — Other Ambulatory Visit: Payer: Self-pay | Admitting: Cardiology

## 2023-12-17 ENCOUNTER — Other Ambulatory Visit: Payer: Self-pay | Admitting: Cardiology

## 2024-01-02 ENCOUNTER — Other Ambulatory Visit: Payer: Self-pay | Admitting: Cardiology

## 2024-01-08 ENCOUNTER — Ambulatory Visit: Payer: Commercial Managed Care - PPO | Admitting: Cardiology

## 2024-01-10 ENCOUNTER — Ambulatory Visit: Payer: Commercial Managed Care - PPO | Admitting: Cardiology

## 2024-01-15 ENCOUNTER — Ambulatory Visit: Payer: Commercial Managed Care - PPO | Attending: Cardiology | Admitting: Cardiology

## 2024-01-15 ENCOUNTER — Encounter: Payer: Self-pay | Admitting: Cardiology

## 2024-01-15 VITALS — BP 124/64 | HR 63 | Ht 72.0 in | Wt 210.8 lb

## 2024-01-15 DIAGNOSIS — I2510 Atherosclerotic heart disease of native coronary artery without angina pectoris: Secondary | ICD-10-CM

## 2024-01-15 DIAGNOSIS — Z79899 Other long term (current) drug therapy: Secondary | ICD-10-CM

## 2024-01-15 DIAGNOSIS — E782 Mixed hyperlipidemia: Secondary | ICD-10-CM | POA: Diagnosis not present

## 2024-01-15 DIAGNOSIS — I471 Supraventricular tachycardia, unspecified: Secondary | ICD-10-CM | POA: Diagnosis not present

## 2024-01-15 DIAGNOSIS — I10 Essential (primary) hypertension: Secondary | ICD-10-CM

## 2024-01-15 NOTE — Patient Instructions (Signed)
 Medication Instructions:   Continue all current medications.   Labwork:  FLP - order given today Reminder:  Nothing to eat or drink after 12 midnight prior to labs. Office will contact with results via phone, letter or mychart.     Testing/Procedures:  none  Follow-Up:  6 months   Any Other Special Instructions Will Be Listed Below (If Applicable).   If you need a refill on your cardiac medications before your next appointment, please call your pharmacy.

## 2024-01-15 NOTE — Progress Notes (Signed)
 Clinical Summary Wyatt Santiago is a 63 y.o.male seen today for follow up of the following medical problems.       1. CAD - admit 03/2015 with chest pain. Received DES to LCX - 07/2015 echo LVEF 50-55%, abnormal diastolic function  01/2018 exercise nuclear stress test: exercised 8 min, no clear ischemia by imaging.      - admit 06/2020 with chest pain - -LHC performed 07/01/20 which showed patent proximal LCX stent and new severe stenosis mid LCX just beyond the old stent with successful PTCA/DES x 1 mid Circumflex 06/2020 echo: LVEF 55-60%, no WMAs, grade I ddx     - no chest pains, no SOB/DOE - compliant with meds       2. PSVT - no recent palpitations.      3. HTN - compliant with meds   4. Hyperlipidemia 06/2023 TC 113 TG 54 HDL 40 LDL 61 06/2023 changed from atorva to crestor  40mg  daily.    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 cath 04/19/15 1.  Mid LAD to Dist LAD lesion, 40% stenosed, S/p PCI and DES to prox LCx with LV EF of 45%.   Hypertension    Seizures (HCC)    "when I was a kid"   Sleep apnea    "they say I've got it but I don't wear mask" (04/19/2015)   SVT (supraventricular tachycardia) (HCC)    Thrombocytopenia (HCC)      No Known Allergies   Current Outpatient Medications  Medication Sig Dispense Refill   amLODipine  (NORVASC ) 5 MG tablet TAKE 1 TABLET(5 MG) BY MOUTH DAILY 90 tablet 2   aspirin  81 MG chewable tablet Chew 1 tablet (81 mg total) by mouth daily.     carvedilol  (COREG ) 25 MG tablet TAKE 1 TABLET BY MOUTH TWICE DAILY 180 tablet 1   lisinopril  (ZESTRIL ) 40 MG tablet TAKE 1 TABLET BY MOUTH EVERY DAY 90 tablet 3   Multiple Vitamin (MULTIVITAMIN) tablet Take 1 tablet by mouth daily.     nitroGLYCERIN  (NITROSTAT ) 0.4 MG SL tablet Place 1  tablet (0.4 mg total) under the tongue every 5 (five) minutes as needed for chest pain (up to 3 doses. If no relief after 3rd dose, proceed to the ED for an evaluation). 30 tablet 2   rosuvastatin  (CRESTOR ) 40 MG tablet Take 1 tablet (40 mg total) by mouth daily. 90 tablet 3   No current facility-administered medications for this visit.     Past Surgical History:  Procedure Laterality Date   CARDIAC CATHETERIZATION N/A 04/19/2015   Procedure: Left Heart Cath and Coronary Angiography;  Surgeon: Arty Binning, MD;  Location: [facility] INVASIVE CV LAB;  Service: Cardiovascular;  Laterality: N/A;   CARDIAC CATHETERIZATION N/A 04/19/2015   Procedure: Coronary Stent Intervention;  Surgeon: Arty Binning, MD;  Location: [facility] INVASIVE CV LAB;  Service: Cardiovascular;  Laterality: N/A;   CORONARY ANGIOPLASTY WITH STENT PLACEMENT  04/19/2015   "1 stent"   CORONARY STENT INTERVENTION N/A 07/01/2020   Procedure: CORONARY STENT INTERVENTION;  Surgeon: Odie Benne, MD;  Location: [facility] INVASIVE CV LAB;  Service: Cardiovascular;  Laterality: N/A;   EYE SURGERY Right    "lasered for macular degeneration"  LEFT HEART CATH AND CORONARY ANGIOGRAPHY N/A 07/01/2020   Procedure: LEFT HEART CATH AND CORONARY ANGIOGRAPHY;  Surgeon: Odie Benne, MD;  Location: [facility] INVASIVE CV LAB;  Service: Cardiovascular;  Laterality: N/A;     No Known Allergies    Family History  Problem Relation Age of Onset   Hypertension Father      Social History Mr. Reeder reports that he quit smoking about 20 months ago. His smoking use included cigarettes. He started smoking about 51 years ago. He has a 50 pack-year smoking history. He has never used smokeless tobacco. Mr. Shavers reports that he does not currently use alcohol after a past usage of about 63.0 standard drinks of alcohol per week.    Physical Examination Vitals:   01/15/24 0820 01/15/24 0835  BP: (!) 140/78 124/64  Pulse:    SpO2:     Filed  Weights   01/15/24 0814  Weight: 210 lb 12.8 oz (95.6 kg)    Gen: resting comfortably, no acute distress HEENT: no scleral icterus, pupils equal round and reactive, no palptable cervical adenopathy,  CV: RRR, no mrg, no jvd Resp: Clear to auscultation bilaterally GI: abdomen is soft, non-tender, non-distended, normal bowel sounds, no hepatosplenomegaly MSK: extremities are warm, no edema.  Skin: warm, no rash Neuro:  no focal deficits Psych: appropriate affect   Diagnostic Studies 07/2015 echo Study Conclusions  - Left ventricle: The cavity size was at the upper limits of   normal. Wall thickness was normal. Systolic function was low   normal. The estimated ejection fraction was in the range of 50%   to 55%. Mild global hypokinesis. Diastolic dysfunction,   indeterminate grade. - Aortic valve: Mildly calcified annulus. - Right atrium: The atrium was mildly dilated.   03/2015 cath Mid LAD to Dist LAD lesion, 40% stenosed. Mid RCA to Dist RCA lesion, 20% stenosed. Ost 1st Mrg lesion, 40% stenosed. Mid Cx-2 lesion, 50% stenosed. Prox Cx lesion, 90% stenosed. There is a 0% residual stenosis post intervention. A drug-eluting stent was placed.   Unstable angina pectoris due to high-grade obstruction in the proximal circumflex with in a tortuous segment. Successful PCI with Synergy drug-eluting stent implantation and reduction in 90% stenosis to 0% with TIMI grade 3 flow. Mild residual first obtuse marginal and mid circumflex obstruction less than 50%. Less than 50% obstruction in the mid LAD. Overall mildly reduced LV function with mid anterior wall hypokinesis and estimated ejection fraction 45%.       Post Cath RECOMMENDATIONS:    Dual antiplatelet therapy (should probably switch to clopidogrel  and aspirin  after one month). For the time being and should remain on Brilinta  and aspirin . Potentially able to discontinue gap therapy after 6 months using Synergy. Potentially  eligible for discharge in a.m. assuming no complications. Ethanol abuse needs to be addressed with the patient and consideration of a plan for cessation instituted.   01/2018 nuclear stress test Blood pressure demonstrated a hypertensive response to exercise. Nonspecific ST changes with stress which resolved in recovery (leads II and III). Defect 1: There is a medium defect of mild severity present in the mid inferoseptal and mid inferior location. This appears to be due to soft tissue attenuation. There were no significant ischemic territories. This is a low risk study. Nuclear stress EF: 58%.     06/2020 echo IMPRESSIONS     1. Left ventricular ejection fraction, by estimation, is 55 to 60%. The  left ventricle has normal function. The left ventricle has  no regional  wall motion abnormalities. Left ventricular diastolic parameters are  consistent with Grade I diastolic  dysfunction (impaired relaxation).   2. Right ventricular systolic function is normal. The right ventricular  size is normal. Tricuspid regurgitation signal is inadequate for assessing  PA pressure.   3. The mitral valve is normal in structure. No evidence of mitral valve  regurgitation. No evidence of mitral stenosis.   4. The aortic valve is tricuspid. Aortic valve regurgitation is not  visualized. No aortic stenosis is present.   5. Aortic dilatation noted. There is mild dilatation of the ascending  aorta, measuring 38 mm.   6. The inferior vena cava is normal in size with greater than 50%  respiratory variability, suggesting right atrial pressure of 3 mmHg.           Assessment and Plan   1. CAD - denies any recent symptoms.  - continue current meds -EKG today shows SR, no ischemic changes   2.HTN - bp is at goal on manual recheck, continue current meds   3. Hyperlipidemia -LDL was above goal of <55 on 06/2023 check, we changed him to crestor  40mg  daily at that time - repeat fasting lipid panel.       4. PSVT - no recent symptoms - continue current meds    Laurann Pollock, M.D.

## 2024-04-07 ENCOUNTER — Other Ambulatory Visit: Payer: Self-pay | Admitting: Cardiology

## 2024-05-12 ENCOUNTER — Encounter: Payer: Self-pay | Admitting: *Deleted

## 2024-08-12 LAB — LIPID PANEL
Chol/HDL Ratio: 2.5 ratio (ref 0.0–5.0)
Cholesterol, Total: 130 mg/dL (ref 100–199)
HDL: 52 mg/dL
LDL Chol Calc (NIH): 64 mg/dL (ref 0–99)
Triglycerides: 69 mg/dL (ref 0–149)
VLDL Cholesterol Cal: 14 mg/dL (ref 5–40)

## 2024-08-20 ENCOUNTER — Other Ambulatory Visit: Payer: Self-pay | Admitting: Cardiology

## 2024-09-09 ENCOUNTER — Ambulatory Visit: Payer: Self-pay | Admitting: Cardiology

## 2024-10-14 ENCOUNTER — Ambulatory Visit: Admitting: Physician Assistant
